# Patient Record
Sex: Male | Born: 1945 | Race: White | Hispanic: No | Smoking: Current every day smoker
Health system: Southern US, Community
[De-identification: ages and names within clinical notes are randomized; demographics above are authoritative.]

## PROBLEM LIST (undated history)

## (undated) DIAGNOSIS — F329 Major depressive disorder, single episode, unspecified: Secondary | ICD-10-CM

## (undated) DIAGNOSIS — K219 Gastro-esophageal reflux disease without esophagitis: Secondary | ICD-10-CM

## (undated) DIAGNOSIS — Z8711 Personal history of peptic ulcer disease: Secondary | ICD-10-CM

## (undated) DIAGNOSIS — F4310 Post-traumatic stress disorder, unspecified: Secondary | ICD-10-CM

## (undated) DIAGNOSIS — F32A Depression, unspecified: Secondary | ICD-10-CM

## (undated) DIAGNOSIS — F419 Anxiety disorder, unspecified: Secondary | ICD-10-CM

## (undated) HISTORY — DX: Major depressive disorder, single episode, unspecified: F32.9

## (undated) HISTORY — DX: Depression, unspecified: F32.A

## (undated) HISTORY — DX: Personal history of peptic ulcer disease: Z87.11

## (undated) HISTORY — DX: Anxiety disorder, unspecified: F41.9

## (undated) HISTORY — DX: Gastro-esophageal reflux disease without esophagitis: K21.9

---

## 1984-08-12 DIAGNOSIS — Z8711 Personal history of peptic ulcer disease: Secondary | ICD-10-CM

## 1984-08-12 HISTORY — PX: STOMACH SURGERY: SHX791

## 1984-08-12 HISTORY — DX: Personal history of peptic ulcer disease: Z87.11

## 2000-08-06 ENCOUNTER — Emergency Department (HOSPITAL_COMMUNITY): Admission: EM | Admit: 2000-08-06 | Discharge: 2000-08-06 | Payer: Self-pay | Admitting: Emergency Medicine

## 2000-08-07 ENCOUNTER — Other Ambulatory Visit (HOSPITAL_COMMUNITY): Admission: RE | Admit: 2000-08-07 | Discharge: 2000-08-07 | Payer: Self-pay | Admitting: Psychiatry

## 2000-08-08 ENCOUNTER — Other Ambulatory Visit (HOSPITAL_COMMUNITY): Admission: RE | Admit: 2000-08-08 | Discharge: 2000-08-18 | Payer: Self-pay | Admitting: Psychiatry

## 2002-10-30 ENCOUNTER — Emergency Department (HOSPITAL_COMMUNITY): Admission: EM | Admit: 2002-10-30 | Discharge: 2002-10-31 | Payer: Self-pay | Admitting: Emergency Medicine

## 2014-10-28 ENCOUNTER — Encounter: Payer: Self-pay | Admitting: Family

## 2014-10-28 ENCOUNTER — Ambulatory Visit (INDEPENDENT_AMBULATORY_CARE_PROVIDER_SITE_OTHER): Payer: PPO | Admitting: Family

## 2014-10-28 VITALS — BP 130/62 | HR 62 | Temp 97.9°F | Resp 16 | Ht 68.0 in | Wt 121.2 lb

## 2014-10-28 DIAGNOSIS — I10 Essential (primary) hypertension: Secondary | ICD-10-CM

## 2014-10-28 DIAGNOSIS — R519 Headache, unspecified: Secondary | ICD-10-CM

## 2014-10-28 DIAGNOSIS — R972 Elevated prostate specific antigen [PSA]: Secondary | ICD-10-CM

## 2014-10-28 DIAGNOSIS — K219 Gastro-esophageal reflux disease without esophagitis: Secondary | ICD-10-CM

## 2014-10-28 DIAGNOSIS — Z720 Tobacco use: Secondary | ICD-10-CM

## 2014-10-28 DIAGNOSIS — R51 Headache: Secondary | ICD-10-CM

## 2014-10-28 DIAGNOSIS — H269 Unspecified cataract: Secondary | ICD-10-CM

## 2014-10-28 DIAGNOSIS — F411 Generalized anxiety disorder: Secondary | ICD-10-CM

## 2014-10-28 DIAGNOSIS — Z87898 Personal history of other specified conditions: Secondary | ICD-10-CM

## 2014-10-28 LAB — BASIC METABOLIC PANEL
BUN: 9 mg/dL (ref 6–23)
CHLORIDE: 101 meq/L (ref 96–112)
CO2: 24 meq/L (ref 19–32)
CREATININE: 0.85 mg/dL (ref 0.50–1.35)
Calcium: 9.3 mg/dL (ref 8.4–10.5)
Glucose, Bld: 86 mg/dL (ref 70–99)
Potassium: 5.2 mEq/L (ref 3.5–5.3)
Sodium: 134 mEq/L — ABNORMAL LOW (ref 135–145)

## 2014-10-28 MED ORDER — FLUOXETINE HCL 40 MG PO CAPS: 40.0000 mg | ORAL_CAPSULE | Freq: Every day | ORAL | Status: DC

## 2014-10-28 MED ORDER — LISINOPRIL 10 MG PO TABS: 10.0000 mg | ORAL_TABLET | Freq: Every day | ORAL | Status: DC

## 2014-10-28 MED ORDER — MELOXICAM 7.5 MG PO TABS: 7.5000 mg | ORAL_TABLET | Freq: Every day | ORAL | Status: AC | PRN

## 2014-10-28 MED ORDER — ZOLPIDEM TARTRATE 10 MG PO TABS: 10.0000 mg | ORAL_TABLET | Freq: Every evening | ORAL | Status: DC | PRN
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
## 2014-10-28 NOTE — Patient Instructions (Addendum)
You will be contact about your referral to Urology.   Please let us know if you have not heard back within 1 week about your referral. Complete lab work prior to leaving. Follow up in 1 month for a medicare wellness visit.  Welcome to [facility]!

## 2014-10-28 NOTE — Progress Notes (Signed)
Pre visit review using our clinic review tool, if applicable. No additional management support is needed unless otherwise documented below in the visit note. 

## 2014-10-29 DIAGNOSIS — R51 Headache: Secondary | ICD-10-CM

## 2014-10-29 DIAGNOSIS — I10 Essential (primary) hypertension: Secondary | ICD-10-CM | POA: Insufficient documentation

## 2014-10-29 DIAGNOSIS — F329 Major depressive disorder, single episode, unspecified: Secondary | ICD-10-CM | POA: Insufficient documentation

## 2014-10-29 DIAGNOSIS — H269 Unspecified cataract: Secondary | ICD-10-CM | POA: Insufficient documentation

## 2014-10-29 DIAGNOSIS — R519 Headache, unspecified: Secondary | ICD-10-CM | POA: Insufficient documentation

## 2014-10-29 DIAGNOSIS — K219 Gastro-esophageal reflux disease without esophagitis: Secondary | ICD-10-CM | POA: Insufficient documentation

## 2014-10-29 DIAGNOSIS — F32A Depression, unspecified: Secondary | ICD-10-CM | POA: Insufficient documentation

## 2014-10-29 DIAGNOSIS — Z87898 Personal history of other specified conditions: Secondary | ICD-10-CM | POA: Insufficient documentation

## 2014-10-29 DIAGNOSIS — F419 Anxiety disorder, unspecified: Secondary | ICD-10-CM

## 2014-10-29 DIAGNOSIS — Z720 Tobacco use: Secondary | ICD-10-CM | POA: Insufficient documentation

## 2014-10-29 NOTE — Assessment & Plan Note (Signed)
Refer to urology for ongoing management.  Reports neg prostate biopsy in past.

## 2014-10-29 NOTE — Assessment & Plan Note (Signed)
Noted on exam, would like referral to opthalmology. Will arrange.

## 2014-10-29 NOTE — Assessment & Plan Note (Signed)
Controlled with use of prn meloxicam.

## 2014-10-29 NOTE — Assessment & Plan Note (Signed)
Currently well controlled. He would like refills of his xanax and [place] today. We discussed that I will not be able to prescribe these meds if he continues marijuana use.  He tells me he would rather continue xanax and [place] and d/c marijuana.  A controlled substance contract is signed today. Refills provided.  He knows that that we will obtain UDS next visit and if + for marijuana, no further refills will be provided.

## 2014-10-29 NOTE — Assessment & Plan Note (Signed)
3-5 minutes spent on tobacco cessation counseling, he is not currently motivated.

## 2014-10-29 NOTE — Assessment & Plan Note (Signed)
Stable on current meds, continue same, obtain bmet.  

## 2014-10-29 NOTE — Assessment & Plan Note (Signed)
Stable on otc prilosec, continue same. 

## 2014-10-31 ENCOUNTER — Telehealth: Payer: Self-pay | Admitting: Family

## 2014-10-31 ENCOUNTER — Encounter: Payer: Self-pay | Admitting: Family

## 2014-10-31 NOTE — Telephone Encounter (Signed)
emmi emailed °

## 2014-11-01 ENCOUNTER — Telehealth: Payer: Self-pay | Admitting: *Deleted

## 2014-11-01 DIAGNOSIS — E871 Hypo-osmolality and hyponatremia: Secondary | ICD-10-CM

## 2014-11-01 NOTE — Telephone Encounter (Signed)
Notified pt and scheduled lab appt for 11/07/14 at 1:15pm. Future lab order entered.

## 2014-11-01 NOTE — Telephone Encounter (Signed)
-----   Message from Sandford CrazeMelissa O'Sullivan, NP sent at 10/29/2014  8:06 AM EDT ----- Sodium is a little low, repeat bmet in 1 week, dx hyponatremia.

## 2014-11-07 ENCOUNTER — Other Ambulatory Visit (INDEPENDENT_AMBULATORY_CARE_PROVIDER_SITE_OTHER): Payer: PPO

## 2014-11-07 DIAGNOSIS — E871 Hypo-osmolality and hyponatremia: Secondary | ICD-10-CM

## 2014-11-07 LAB — BASIC METABOLIC PANEL
BUN: 7 mg/dL (ref 6–23)
CHLORIDE: 99 meq/L (ref 96–112)
CO2: 28 meq/L (ref 19–32)
CREATININE: 0.96 mg/dL (ref 0.40–1.50)
Calcium: 9.9 mg/dL (ref 8.4–10.5)
GFR: 83 mL/min (ref >60.00)
Glucose, Bld: 105 mg/dL — ABNORMAL HIGH (ref 70–99)
Potassium: 5.9 mEq/L — ABNORMAL HIGH (ref 3.5–5.1)
SODIUM: 132 meq/L — AB (ref 135–145)

## 2014-11-09 ENCOUNTER — Telehealth: Payer: Self-pay | Admitting: Family

## 2014-11-09 MED ORDER — ALPRAZOLAM 1 MG PO TABS: 1.0000 mg | ORAL_TABLET | Freq: Four times a day (QID) | ORAL | Status: DC

## 2014-11-09 NOTE — Telephone Encounter (Signed)
Notified pt. He states that he is heading out of town and doesn't know when he will be back. Advised pt that we will discard current Rx and he may notify us when he is back in town and we will address below recommendation at that time.  Pt voices understanding.

## 2014-11-09 NOTE — Telephone Encounter (Signed)
I will allow this refill with the stipulation that he come pick up the Rx in office today and provide a UDS sample today.  Again as Melissa stated, he is not to test positive for marijuana further if he is to continue receiving medication. .  I will also only allow a limited fill -- 60 tablets to carry him until UDS result is in. Rx and UDS note at front desk

## 2014-11-09 NOTE — Telephone Encounter (Signed)
Cody--- Would you be able to provide a Xanax Rx for this pt or [facility]? Please see 10/28/14 office note.

## 2014-11-09 NOTE — Telephone Encounter (Signed)
Caller name:Dettmann, Corin Relation to ZO:XWRUpt:self Call back numbeSharma Covertr:[phone] Pharmacy:  Reason for call: pt is needing rxALPRAZolam (XANAX) 1 MG tablet , please call when available and ready for pick up. Pt needs the rx because [place] soon as he can get the rx

## 2014-11-14 NOTE — Telephone Encounter (Signed)
Patient called in stating that he is back in town and is requesting to do UDS. Best # [phone]

## 2014-11-14 NOTE — Telephone Encounter (Signed)
Melissa-- Please see below messages and advise if ok to reprint Rx and have pt complete UDS when he picks up Rx?

## 2014-11-14 NOTE — Telephone Encounter (Signed)
Please have him complete UDS.  Too soon to refill his xanax.

## 2014-11-15 MED ORDER — ALPRAZOLAM 1 MG PO TABS
1.0000 mg | ORAL_TABLET | Freq: Four times a day (QID) | ORAL | Status: DC
Start: 2014-11-15 — End: 2014-11-25

## 2014-11-15 NOTE — Telephone Encounter (Signed)
Per verbal from Provider, ok to provider Alprazolam Rx as previous Rx printed on 11/09/14 was shredded. Rx printed for Provider signature.

## 2014-11-15 NOTE — Telephone Encounter (Signed)
Notified pt and he voices understanding. Rx placed at front desk with note to obtain UDS at time of pick up.

## 2014-11-15 NOTE — Addendum Note (Signed)
Addended by: Mervin KungFERGERSON, Myalynn Lingle A on: 11/15/2014 08:46 AM   Modules accepted: Orders

## 2014-11-25 ENCOUNTER — Other Ambulatory Visit: Payer: Self-pay | Admitting: Family

## 2014-11-25 ENCOUNTER — Encounter: Payer: Self-pay | Admitting: Family

## 2014-12-02 ENCOUNTER — Encounter: Payer: Self-pay | Admitting: *Deleted

## 2014-12-02 ENCOUNTER — Telehealth: Payer: Self-pay | Admitting: *Deleted

## 2014-12-02 NOTE — Telephone Encounter (Signed)
Pre-Visit Call completed with patient and chart updated.   Pre-Visit Info documented in Specialty Comments under [employer].    

## 2014-12-05 ENCOUNTER — Ambulatory Visit (INDEPENDENT_AMBULATORY_CARE_PROVIDER_SITE_OTHER): Payer: PPO | Admitting: Family

## 2014-12-05 ENCOUNTER — Encounter: Payer: Self-pay | Admitting: Family

## 2014-12-05 ENCOUNTER — Telehealth: Payer: Self-pay | Admitting: Family

## 2014-12-05 VITALS — BP 124/78 | HR 75 | Temp 97.5°F | Resp 16 | Ht 68.0 in | Wt 115.4 lb

## 2014-12-05 DIAGNOSIS — Z0000 Encounter for general adult medical examination without abnormal findings: Secondary | ICD-10-CM

## 2014-12-05 DIAGNOSIS — Z23 Encounter for immunization: Secondary | ICD-10-CM | POA: Diagnosis not present

## 2014-12-05 DIAGNOSIS — E871 Hypo-osmolality and hyponatremia: Secondary | ICD-10-CM | POA: Diagnosis not present

## 2014-12-05 DIAGNOSIS — R634 Abnormal weight loss: Secondary | ICD-10-CM

## 2014-12-05 DIAGNOSIS — I10 Essential (primary) hypertension: Secondary | ICD-10-CM

## 2014-12-05 DIAGNOSIS — F411 Generalized anxiety disorder: Secondary | ICD-10-CM

## 2014-12-05 DIAGNOSIS — Z87898 Personal history of other specified conditions: Secondary | ICD-10-CM

## 2014-12-05 DIAGNOSIS — F1011 Alcohol abuse, in remission: Secondary | ICD-10-CM

## 2014-12-05 DIAGNOSIS — H9190 Unspecified hearing loss, unspecified ear: Secondary | ICD-10-CM

## 2014-12-05 DIAGNOSIS — Z720 Tobacco use: Secondary | ICD-10-CM

## 2014-12-05 LAB — BASIC METABOLIC PANEL
BUN: 6 mg/dL (ref 6–23)
CHLORIDE: 97 meq/L (ref 96–112)
CO2: 25 mEq/L (ref 19–32)
CREATININE: 0.79 mg/dL (ref 0.40–1.50)
Calcium: 9.6 mg/dL (ref 8.4–10.5)
GFR: 103.9 mL/min (ref >60.00)
GLUCOSE: 106 mg/dL — AB (ref 70–99)
POTASSIUM: 5 meq/L (ref 3.5–5.1)
SODIUM: 127 meq/L — AB (ref 135–145)

## 2014-12-05 LAB — TSH: TSH: 0.57 u[IU]/mL (ref 0.35–4.50)

## 2014-12-05 NOTE — Assessment & Plan Note (Signed)
Pt was counseled on smoking cessation.

## 2014-12-05 NOTE — Progress Notes (Signed)
Subjective:    Patient ID: Blake Little, male    DOB: Aug 11, 1946, 69 y.o.   MRN: [id number]  HPI    Review of Systems  Constitutional: Positive for unexpected weight change.       Wt Readings from Last 3 Encounters: 12/05/14 : 115 lb 6.4 oz (52.345 kg) 10/28/14 : 121 lb 3.2 oz (54.976 kg) Has had some weight loss- only eats when he gets hungry.  Lives with sister who does not cook  HENT: Positive for hearing loss.   Eyes: Negative for visual disturbance.  Respiratory: Negative for cough.   Cardiovascular: Negative for leg swelling.  Gastrointestinal: Negative for constipation.  Genitourinary: Positive for frequency.  Musculoskeletal: Negative for arthralgias.       Some neck pain with turning- briefly  Skin: Negative for rash.  Neurological: Negative for headaches.  Hematological: Negative for adenopathy.  Psychiatric/Behavioral:       See HPI    Past Medical History  Diagnosis Date  . Depression   . GERD (gastroesophageal reflux disease)   . History of bleeding ulcers 1986    hospitalized    History   Social History  . Marital Status: Unknown    Spouse Name: N/A  . Number of Children: N/A  . Years of Education: N/A   Occupational History  . Not on file.   Social History Main Topics  . Smoking status: Current Every Day Smoker -- 0.50 packs/day for 50 years    Types: Cigarettes  . Smokeless tobacco: Not on file  . Alcohol Use: No  . Drug Use: Yes     Comment: marijuana   . Sexual Activity: Not on file   Other Topics Concern  . Not on file   Social History Narrative   In Divorce   3 sons- [place] in [place], other sons live locally   1 daughter- lives locally   Brother and sisters live locally (1 of 7 children)   Retired Cabin crewtrain driver    Completed HS and technically schools   Enjoys golf/outside activities       Past Surgical History  Procedure Laterality Date  . Stomach surgery  1986    due to ulcers, pt unsure of procedure    Family  History  Problem Relation Age of Onset  . COPD Mother   . Heart attack Father     Allergies  Allergen Reactions  . Asa [Aspirin]     Abdominal pain    Current Outpatient Prescriptions on File Prior to Visit  Medication Sig Dispense Refill  . acetaminophen (TYLENOL) 650 MG CR tablet Take 650 mg by mouth every 8 (eight) hours as needed for pain.    Marland Kitchen. ALPRAZolam (XANAX) 1 MG tablet Take 1 mg by mouth every 6 (six) hours as needed for anxiety.    Marland Kitchen. FLUoxetine (PROZAC) 40 MG capsule Take 1 capsule (40 mg total) by mouth daily. 30 capsule 5  . lisinopril (PRINIVIL,ZESTRIL) 10 MG tablet Take 1 tablet (10 mg total) by mouth daily. 30 tablet 5  . meloxicam (MOBIC) 7.5 MG tablet Take 1 tablet (7.5 mg total) by mouth daily as needed for pain. 30 tablet 5  . omeprazole (PRILOSEC) 20 MG capsule Take 20 mg by mouth daily.     No current facility-administered medications on file prior to visit.    BP 124/78 mmHg  Pulse 75  Temp(Src) 97.5 F (36.4 C) (Oral)  Resp 16  Ht 5\' 8"  (1.727 m)  Wt 115 lb  6.4 oz (52.345 kg)  BMI 17.55 kg/m2  SpO2 99%        Objective:   Physical Exam  Physical Exam  Constitutional: Thin white male. He is oriented to person, place, and time. He appears well-developed and well-nourished. No distress.  HENT:  Head: Normocephalic and atraumatic.  Right Ear: Tympanic membrane and ear canal normal.  Left Ear: Tympanic membrane and ear canal normal.  Mouth/Throat: Oropharynx is clear and moist.  Eyes: Pupils are equal, round, and reactive to light. No scleral icterus.  Neck: Normal range of motion. No thyromegaly present.  Cardiovascular: Normal rate and regular rhythm.   No murmur heard. Pulmonary/Chest: Effort normal and breath sounds normal. No respiratory distress. He has no wheezes. He has no rales. He exhibits no tenderness.  Abdominal: Soft. Bowel sounds are normal. He exhibits no distension and no mass. There is no tenderness. There is no rebound and no  guarding.  Musculoskeletal: He exhibits no edema.  Lymphadenopathy:    He has no cervical adenopathy.  Neurological: He is alert and oriented to person, place, and time. He has normal patellar reflexes. He exhibits normal muscle tone. Coordination normal.  Skin: Skin is warm and dry.  Psychiatric: He has a normal mood and affect. His behavior is normal. Judgment and thought content normal.          Assessment & Plan:         Assessment & Plan:

## 2014-12-05 NOTE — Assessment & Plan Note (Addendum)
Discussed healthy diet ( need to eat more calories).  Will obtain TSH due to weight loss.  If weight loss persists, may need to explore w/u for possibility of underlying malignancy.  We will recheck weight in 1 month along with some memory testing. Refer for colonoscopy. Td   today. Consider zostavax next visit if he finds his insurance will cover. Refer to audiology to evaluate hearing loss.

## 2014-12-05 NOTE — Assessment & Plan Note (Signed)
Discussed with pt need to discontinue marijuana. We discussed that we will drug test him next time refill is due. If still positive for marijuana- will no longer refill ativan. He reports last use was several weeks ago.

## 2014-12-05 NOTE — Telephone Encounter (Signed)
Could you please mail a copy of the MOST form and HCPOA form to pt? Thanks.

## 2014-12-05 NOTE — Telephone Encounter (Signed)
Forms mailed 

## 2014-12-05 NOTE — Assessment & Plan Note (Signed)
Management per urology 

## 2014-12-05 NOTE — Progress Notes (Signed)
Pre visit review using our clinic review tool, if applicable. No additional management support is needed unless otherwise documented below in the visit note. 

## 2014-12-05 NOTE — Patient Instructions (Addendum)
Follow up in 1 month for memory testing.   Work on eating 3 well balanced meals and 2 snacks a day. Work on eliminating tobacco and marijuana. Complete lab work prior to leaving.

## 2014-12-05 NOTE — Progress Notes (Signed)
Patient ID: CHRISTERPHER CLOS, male   DOB: 1946-07-27, 69 y.o.   MRN: [id number]  Subjective:    Blake Little is a 69 y.o. male who presents for Medicare Annual/Subsequent preventive examination.     Preventive Screening-Counseling & Management  Tobacco History  Smoking status  . Current Every Day Smoker -- 0.50 packs/day for 50 years  . Types: Cigarettes  Smokeless tobacco  . Not on file    Problems Prior to Visit 1. Anxiety- failed UDS last month- + cannabis. Reports that he takes one around 12 pm and one around 6pm. He continues prozac.    2) HTN-  Patient is currently maintained on the following medications for blood pressure: lisinopril Patient reports good compliance with blood pressure medications. Patient denies chest pain, shortness of breath or swelling. Last 3 blood pressure readings in our office are as follows: BP Readings from Last 3 Encounters:  12/05/14 124/78  10/28/14 130/62   3) Elevated PSA- following with Dr. Brunilda Payor.   Preventative care:    Allergies verified: UTD   Immunization Status: prevnar 06/13/14 Flu vaccine-- has not had  Tdap-- unknown  PNA-- reports that he got this at the pharmacy [facility]) reports > 1 year ago.   Shingles-- has not had, would like, notified to check with insurance for coverage   A/P:  Changes to FH, PSH or Personal Hx: CCS: never that he knows if PSA: per urology Bone Density: has not had   Care Teams Updated: Su Grand, MD- Urology   ED/Hospital/Urgent Care Visits: None recent   Current Problems (verified) Patient Active Problem List   Diagnosis Date Noted  . Anxiety state 10/29/2014  . Hypertension 10/29/2014  . GERD (gastroesophageal reflux disease) 10/29/2014  . Tobacco abuse 10/29/2014  . History of elevated PSA 10/29/2014  . Chronic headaches 10/29/2014  . Cataracts, bilateral 10/29/2014    Medications Prior to Visit Current Outpatient Prescriptions on File Prior to Visit  Medication Sig Dispense  Refill  . acetaminophen (TYLENOL) 650 MG CR tablet Take 650 mg by mouth every 8 (eight) hours as needed for pain.    Marland Kitchen ALPRAZolam (XANAX) 1 MG tablet Take 1 mg by mouth every 6 (six) hours as needed for anxiety.    Marland Kitchen FLUoxetine (PROZAC) 40 MG capsule Take 1 capsule (40 mg total) by mouth daily. 30 capsule 5  . lisinopril (PRINIVIL,ZESTRIL) 10 MG tablet Take 1 tablet (10 mg total) by mouth daily. 30 tablet 5  . meloxicam (MOBIC) 7.5 MG tablet Take 1 tablet (7.5 mg total) by mouth daily as needed for pain. 30 tablet 5  . omeprazole (PRILOSEC) 20 MG capsule Take 20 mg by mouth daily.     No current facility-administered medications on file prior to visit.    Current Medications (verified) Current Outpatient Prescriptions  Medication Sig Dispense Refill  . acetaminophen (TYLENOL) 650 MG CR tablet Take 650 mg by mouth every 8 (eight) hours as needed for pain.    Marland Kitchen ALPRAZolam (XANAX) 1 MG tablet Take 1 mg by mouth every 6 (six) hours as needed for anxiety.    Marland Kitchen FLUoxetine (PROZAC) 40 MG capsule Take 1 capsule (40 mg total) by mouth daily. 30 capsule 5  . lisinopril (PRINIVIL,ZESTRIL) 10 MG tablet Take 1 tablet (10 mg total) by mouth daily. 30 tablet 5  . meloxicam (MOBIC) 7.5 MG tablet Take 1 tablet (7.5 mg total) by mouth daily as needed for pain. 30 tablet 5  . omeprazole (PRILOSEC) 20 MG capsule Take  20 mg by mouth daily.     No current facility-administered medications for this visit.     Allergies (verified) Asa   PAST HISTORY  Family History Family History  Problem Relation Age of Onset  . COPD Mother   . Heart attack Father     Social History History  Substance Use Topics  . Smoking status: Current Every Day Smoker -- 0.50 packs/day for 50 years    Types: Cigarettes  . Smokeless tobacco: Not on file  . Alcohol Use: No    Are there smokers in your home (other than you)?  Yes  Risk Factors Current exercise habits: plays golf regularly, usually walks  Dietary issues  discussed: healthy diet discussed   Cardiac risk factors: advanced age (older than 62 for men, 36 for women), hypertension, male gender and smoking/ tobacco exposure.  Depression Screen (Note: if answer to either of the following is "Yes", a more complete depression screening is indicated)   Q1: Over the past two weeks, have you felt down, depressed or hopeless? No  Q2: Over the past two weeks, have you felt little interest or pleasure in doing things? No  Have you lost interest or pleasure in daily life? No  Do you often feel hopeless? No  Do you cry easily over simple problems? No  Activities of Daily Living In your present state of health, do you have any difficulty performing the following activities?:  Driving? No Managing money?  No Feeding yourself? no Getting from bed to chair? No Climbing a flight of stairs? No Preparing food and eating?: No Bathing or showering? No Getting dressed: No Getting to the toilet? No Using the toilet:No Moving around from place to place: No In the past year have you fallen or had a near fall?:Yes- reports that he went to the hospital last July follow a syncopal event.  Was told that it was due to benzo withdrawal.    Are you sexually active?  No  Do you have more than one partner?  No  Hearing Difficulties: Yes Do you often ask people to speak up or repeat themselves? Yes Do you experience ringing or noises in your ears? No Do you have difficulty understanding soft or whispered voices? Yes   Do you feel that you have a problem with memory? Yes- some issue with short term memory.  periodi confusion.    Do you often misplace items? Yes  Do you feel safe at home?  Yes  Cognitive Testing  Alert? Yes  Normal Appearance?Yes  Oriented to person? Yes  Place? Yes   Time? Yes  Recall of three objects?  Yes  Can perform simple calculations? Yes  Displays appropriate judgment?Yes  Can read the correct time from a watch face?Yes   Advanced  Directives have been discussed with the patient? Yes   List the Names of Other Physician/Practitioners you currently use: 1.    Indicate any recent Medical Services you may have received from other than [facility] providers in the past year (date may be approximate).  Immunization History  Administered Date(s) Administered  . Influenza-Unspecified 06/13/2014  . Pneumococcal Conjugate-13 06/13/2014    Screening Tests Health Maintenance  Topic Date Due  . TETANUS/TDAP  09/06/1966  . COLONOSCOPY  09/06/1997  . ZOSTAVAX  09/07/2007  . PNA vac Low Risk Adult (1 of 2 - PCV13) 09/06/2012  . INFLUENZA VACCINE  03/13/2015    All answers were reviewed with the patient and necessary referrals were made:  Little,Blake Demasi  S., NP   12/05/2014   History reviewed: allergies, current medications, past family history, past medical history, past social history, past surgical history and problem list  Review of Systems see above.     Objective:     Vision by Snellen chart: see nursing.  Blood pressure 124/78, pulse 75, temperature 97.5 F (36.4 C), temperature source Oral, resp. rate 16, height 5\' 8"  (1.727 m), weight 115 lb 6.4 oz (52.345 kg), SpO2 99 %. Body mass index is 17.55 kg/(m^2).     Assessment:          Plan:     During the course of the visit the patient was educated and counseled about appropriate screening and preventive services including:    Td vaccine  Colorectal cancer screening  Smoking cessation counseling  Advanced directives: has an advanced directive - a copy has been provided, .  Diet review for nutrition referral? Yes ____  Not Indicated __x__   Patient Instructions (the written plan) was given to the patient.  Medicare Attestation I have personally reviewed: The patient's medical and social history Their use of alcohol, tobacco or illicit drugs Their current medications and supplements The patient's functional ability including ADLs,fall  risks, home safety risks, cognitive, and hearing and visual impairment Diet and physical activities Evidence for depression or mood disorders  The patient's weight, height, BMI, and visual acuity have been recorded in the chart.  I have made referrals, counseling, and provided education to the patient based on review of the above and I have provided the patient with a written personalized care plan for preventive services.     Little,Blake Wieman S., NP   12/05/2014

## 2014-12-05 NOTE — Assessment & Plan Note (Signed)
BP Readings from Last 3 Encounters:  12/05/14 124/78  10/28/14 130/62   BP stable, continue lisinopril.

## 2014-12-06 ENCOUNTER — Telehealth: Payer: Self-pay | Admitting: Family

## 2014-12-06 DIAGNOSIS — Z87891 Personal history of nicotine dependence: Secondary | ICD-10-CM

## 2014-12-06 DIAGNOSIS — E871 Hypo-osmolality and hyponatremia: Secondary | ICD-10-CM

## 2014-12-06 DIAGNOSIS — R739 Hyperglycemia, unspecified: Secondary | ICD-10-CM

## 2014-12-06 NOTE — Telephone Encounter (Signed)
Sugar also a little high, i will add a1c.

## 2014-12-06 NOTE — Telephone Encounter (Signed)
Notified pt and he will return to the lab tomorrow at 11am for additional tests. Orders signed. Pt agreeable to proceed with CT chest. Pt states he drinks at least 8 cups of coffee daily and 3-4  12oz bottles of water a day. Advised pt of below recommendation and he voices understanding.

## 2014-12-06 NOTE — Telephone Encounter (Signed)
Please let pt know that his sodium is very low.  I would like him to return for the lab work listed below as well as a CT chest to screen for lung cancer due to hx of tobacco abuse.  He should liberalize sodium in his diet.  Do not drink more than 64 ounces of fluid a day. How much is he drinking now?

## 2014-12-07 ENCOUNTER — Other Ambulatory Visit (INDEPENDENT_AMBULATORY_CARE_PROVIDER_SITE_OTHER): Payer: PPO

## 2014-12-07 DIAGNOSIS — R739 Hyperglycemia, unspecified: Secondary | ICD-10-CM | POA: Diagnosis not present

## 2014-12-07 DIAGNOSIS — E871 Hypo-osmolality and hyponatremia: Secondary | ICD-10-CM

## 2014-12-07 LAB — HEMOGLOBIN A1C: HEMOGLOBIN A1C: 6 % (ref 4.6–6.5)

## 2014-12-08 ENCOUNTER — Telehealth: Payer: Self-pay | Admitting: Acute Care

## 2014-12-08 ENCOUNTER — Ambulatory Visit (HOSPITAL_BASED_OUTPATIENT_CLINIC_OR_DEPARTMENT_OTHER)
Admission: RE | Admit: 2014-12-08 | Discharge: 2014-12-08 | Disposition: A | Discharge status: Home / Self Care | Payer: PPO | Source: Ambulatory Visit | Attending: Family | Admitting: Family

## 2014-12-08 ENCOUNTER — Encounter: Payer: Self-pay | Admitting: Acute Care

## 2014-12-08 ENCOUNTER — Telehealth: Payer: Self-pay | Admitting: *Deleted

## 2014-12-08 DIAGNOSIS — Z87891 Personal history of nicotine dependence: Secondary | ICD-10-CM | POA: Diagnosis not present

## 2014-12-08 DIAGNOSIS — Z122 Encounter for screening for malignant neoplasm of respiratory organs: Secondary | ICD-10-CM | POA: Insufficient documentation

## 2014-12-08 LAB — OSMOLALITY: Osmolality: 275 mOsm/kg (ref 275–300)

## 2014-12-08 LAB — SODIUM, URINE, RANDOM: Sodium, Ur: 78 mEq/L

## 2014-12-08 LAB — OSMOLALITY, URINE: Osmolality, Ur: 581 mOsm/kg (ref 390–1090)

## 2014-12-08 NOTE — Telephone Encounter (Signed)
Follow up call and message left for Blake [facility]Ashley [facility] regarding the resolve of qualifying this patient for a Low Dose lung cancer screening CT Scan. After assessing the patient's smoking history more closely, we determined he does qualify for the program, and the appropriate scan was performed.

## 2014-12-08 NOTE — Telephone Encounter (Signed)
I received a call today ( 12/08/14) from the CT tech at [facility]. [name] was scheduled for a LDCT scan ( ordered by Daryel GeraldMelissa Sullivan, NP), however the documented pack year history did not qualify him for the Lung Cancer Screening  program. I attempted to call Daryel GeraldMelissa Sullivan, who is out of the office today, to have her assist in assessing if this patient qualifies for the program. Instead I had Maralyn SagoSarah from CT call me so I could actually speak with the patient and get a more accurate smoking history from him. After speaking with the patient he clarified that he has been smoking since he was 69 years old, at least 15 cigarettes (  3/4 pack) a day. This calculates to a 39.75 pack year (.75 PPD x 53 years) history, which does qualify him for the program. Based on this information, we did the screening CT scan as ordered. I have corrected the patient's smoking history in EPIC.

## 2014-12-08 NOTE — Telephone Encounter (Signed)
Pulmonary department calling regarding orders- RN was hoping to talk to Sandford CrazeMelissa O'Sullivan and stated she would call back with a request once she figured out what the MD wanted ordered.

## 2014-12-11 ENCOUNTER — Telehealth: Payer: Self-pay | Admitting: Family

## 2014-12-11 NOTE — Telephone Encounter (Signed)
-----   Message from Oretha Milchakesh Alva V, MD sent at 12/11/2014 10:10 AM EDT ----- 3 month FU ok He has impressive bronchiectasis We should probably see him Marcelino DusterMichelle, pl make appt for [facility] office- not urgent RA ----- Message -----    From: Sandford CrazeMelissa O'Sullivan, NP    Sent: 12/09/2014  12:55 PM      To: Oretha Milchakesh Alva V, MD  Kathy Breachakesh,  Would you mind glancing at his CT chest?  I am concerned re: recent unintentional weight loss and hyponatremia (work up consistent with SIADH).  I am thinking that we may need to pursue more timely work up of his abnormal CT chest findings rather than a 3 month follow up.  What are your thoughts?  I have not yet discussed CT findings with him, I wanted to present work up plan to him.  I an arrange formal referral to you if appropriate.    Thanks,  General MillsMelissa

## 2014-12-11 NOTE — Telephone Encounter (Signed)
Please let patient know that I reviewed his CT scan.  There are some lung nodules that need re-evaluation on CT in 3 months.  I would also like for him to meet with pulmonary. They will contact him with an appointment.

## 2014-12-12 NOTE — Telephone Encounter (Signed)
Notified pt and he voices understanding. Lab appt scheduled for 12/16/14 at 10:15am (UDS). Awaiting result to determine if Rx can be given.

## 2014-12-12 NOTE — Telephone Encounter (Signed)
Notified pt and he voices understanding. Pt states he has stopped using marijuana and would like an Rx for Alprazolam. States he really needs something for his anxiety.  Please advise.

## 2014-12-12 NOTE — Telephone Encounter (Signed)
Left message for pt to return my call.

## 2014-12-12 NOTE — Telephone Encounter (Signed)
Since he failed previous drug screen, I will need to see a negative drug screen prior to providing any additional refills.  He can leave UDS at his convenience.

## 2014-12-15 ENCOUNTER — Ambulatory Visit (INDEPENDENT_AMBULATORY_CARE_PROVIDER_SITE_OTHER): Payer: PPO | Admitting: Pulmonary Disease

## 2014-12-15 ENCOUNTER — Encounter: Payer: Self-pay | Admitting: Pulmonary Disease

## 2014-12-15 VITALS — BP 164/86 | HR 71 | Temp 97.6°F | Ht 68.0 in | Wt 120.0 lb

## 2014-12-15 DIAGNOSIS — J432 Centrilobular emphysema: Secondary | ICD-10-CM

## 2014-12-15 DIAGNOSIS — J439 Emphysema, unspecified: Secondary | ICD-10-CM | POA: Insufficient documentation

## 2014-12-15 DIAGNOSIS — R0602 Shortness of breath: Secondary | ICD-10-CM | POA: Diagnosis not present

## 2014-12-15 DIAGNOSIS — R918 Other nonspecific abnormal finding of lung field: Secondary | ICD-10-CM | POA: Insufficient documentation

## 2014-12-15 DIAGNOSIS — J984 Other disorders of lung: Secondary | ICD-10-CM | POA: Diagnosis not present

## 2014-12-15 NOTE — Progress Notes (Signed)
Subjective:    Patient ID: Blake Little, male    DOB: 1946-04-23, 69 y.o.   MRN: [id number]  HPI  Chief Complaint  Patient presents with  . Advice Only    Referred by Dr. Peggyann Juba'sullivan, discuss CT results.  gets night sweats (comes and goes)   69 year old smoker presents for evaluation of abnormal CT scan. he has been smoking since he was 69 years old, at least 15 cigarettes ( 3/4 pack) a day-about 40 Pyrs. He underwent low-dose CT screening which showed multiple pulmonary nodules, moderate paraseptal emphysema and scattered changes of bronchiectasis. He reports dyspnea on exertion-but he admits that he leads a sedentary life. He can walk in the store and carries groceries and can climb one flight of stairs. He denies frequent chest colds. He reports a chronic dry cough. He is unfortunately going through a divorce. [place] moved from [place] to be closer to his family.  Ct chest (LDCT) 12/08/14 >>multiple pulmonary nodules , largest 10mm RUL, moderate emphysema, scattered bronchiectasis Spirometry 12/2014 >>Moderate airway obstruction with a ratio 47 , FEV1 52% , FVC 86%   Past Medical History  Diagnosis Date  . Depression   . GERD (gastroesophageal reflux disease)   . History of bleeding ulcers 1986    hospitalized    Past Surgical History  Procedure Laterality Date  . Stomach surgery  1986    due to ulcers, pt unsure of procedure     Allergies  Allergen Reactions  . Asa [Aspirin]     Abdominal pain    History   Social History  . Marital Status: Divorced    Spouse Name: N/A  . Number of Children: N/A  . Years of Education: N/A   Occupational History  . Not on file.   Social History Main Topics  . Smoking status: Current Every Day Smoker -- 0.75 packs/day for 53 years    Types: Cigarettes  . Smokeless tobacco: Not on file  . Alcohol Use: No  . Drug Use: Yes     Comment: marijuana   . Sexual Activity: Not on file   Other Topics Concern  . Not on file   Social  History Narrative   In Divorce   3 sons- youngest son in [place], other sons live locally   1 daughter- lives locally   Brother and sisters live locally (1 of 7 children)   Retired Cabin crewtrain driver    Completed HS and technically schools   Enjoys golf/outside activities       Review of Systems  Constitutional: Negative for fever, chills, activity change, appetite change and unexpected weight change.  HENT: Negative for congestion, dental problem, postnasal drip, rhinorrhea, sneezing, sore throat, trouble swallowing and voice change.   Eyes: Negative for visual disturbance.  Respiratory: Negative for cough, choking and shortness of breath.   Cardiovascular: Negative for chest pain and leg swelling.  Gastrointestinal: Negative for nausea, vomiting and abdominal pain.  Genitourinary: Negative for difficulty urinating.  Musculoskeletal: Negative for arthralgias.  Skin: Negative for rash.  Psychiatric/Behavioral: Negative for behavioral problems and confusion.       Objective:   Physical Exam Gen. Pleasant, well-nourished, in no distress, normal affect ENT - no lesions, no post nasal drip Neck: No JVD, no thyromegaly, no carotid bruits Lungs: no use of accessory muscles, no dullness to percussion, decreased without rales or rhonchi  Cardiovascular: Rhythm regular, heart sounds  normal, no murmurs or gallops, no peripheral edema Abdomen: soft and non-tender, no hepatosplenomegaly, BS  normal. Musculoskeletal: No deformities, no cyanosis or clubbing Neuro:  alert, non focal        Assessment & Plan:

## 2014-12-15 NOTE — Patient Instructions (Addendum)
CT scan shows emphysema & nodules Lung capacity is at 50% FU Ct scan in 3 months Trial of spiriva - call us for Rx if this works

## 2014-12-15 NOTE — Assessment & Plan Note (Signed)
Needs short-term follow-up of pulmonary nodules in 3 months

## 2014-12-15 NOTE — Assessment & Plan Note (Signed)
CT scan shows emphysema & nodules Lung capacity is at 50% FU Ct scan in 3 months Trial of spiriva - call us for Rx if this works [facility]

## 2014-12-16 ENCOUNTER — Other Ambulatory Visit: Payer: PPO

## 2014-12-19 ENCOUNTER — Encounter: Payer: Self-pay | Admitting: Family

## 2014-12-19 DIAGNOSIS — H3530 Unspecified macular degeneration: Secondary | ICD-10-CM | POA: Insufficient documentation

## 2014-12-21 ENCOUNTER — Telehealth: Payer: Self-pay | Admitting: Family

## 2014-12-21 DIAGNOSIS — E871 Hypo-osmolality and hyponatremia: Secondary | ICD-10-CM

## 2014-12-21 MED ORDER — BUPROPION HCL ER (SR) 150 MG PO TB12: ORAL_TABLET | ORAL | Status: DC

## 2014-12-21 NOTE — Telephone Encounter (Signed)
Notified pt and he voices understanding. Scheduled 1 month f/u for 01/20/15 at 10:45am. Lab appt scheduled for tomorrow at 9:45am and lab order entered.  Received call from Amy re: status of UDS. Should have result by tomorrow. Awaiting UDS.

## 2014-12-21 NOTE — Telephone Encounter (Signed)
Caller name: Devun Relation to pt: self Call back number: [phone] [facility]acy:  [facility] [address]  Reason for call:   1.Requesting last lab results.  2. would like a refill of xanax.  3. Patient states that he is trying to stop smoking and would like to try wellbutrin. He states that chantex did not work for him.

## 2014-12-21 NOTE — Telephone Encounter (Signed)
Spoke with pt. He states he left urine for drug screen on 12/16/14. Sent staff message to Amy to check on status. Please advise re: urine osmolality results and wellbutrin Rx as requested below?

## 2014-12-21 NOTE — Telephone Encounter (Signed)
Rx sent for wellbutrin.  He will need to see us back in the office 1 month after starting and call us if he develops any mood changes or concerns after starting.   Sodium studies indicate that the cause for his low sodium could be due to either SIADH which regulates water secretion through the kidneys or less likely due to hormonal issues with his adrenal gland.  I would like him to return to the lab when he is able to complete AM serum cortisol level (dx hyponatremia) to further evaluate.   I can refill xanax once I review his UDS.

## 2014-12-22 ENCOUNTER — Other Ambulatory Visit: Payer: PPO

## 2014-12-22 DIAGNOSIS — E871 Hypo-osmolality and hyponatremia: Secondary | ICD-10-CM

## 2014-12-23 NOTE — Telephone Encounter (Signed)
Patient called in wanting to know if we have the results of the UDS?   Also, states [facility] [address] [place]

## 2014-12-24 LAB — CORTISOL-AM, BLOOD: Cortisol - AM: 21.4 ug/dL (ref 4.3–22.4)

## 2014-12-25 ENCOUNTER — Telehealth: Payer: Self-pay | Admitting: Family

## 2014-12-25 ENCOUNTER — Encounter: Payer: Self-pay | Admitting: Family

## 2014-12-25 DIAGNOSIS — E222 Syndrome of inappropriate secretion of antidiuretic hormone: Secondary | ICD-10-CM

## 2014-12-25 NOTE — Telephone Encounter (Signed)
Cortisol, (hormone level looks normal).  I would like him to see endocrinology for evaluation of his low sodium.

## 2014-12-26 ENCOUNTER — Other Ambulatory Visit: Payer: Self-pay | Admitting: Pulmonary Disease

## 2014-12-26 DIAGNOSIS — F17200 Nicotine dependence, unspecified, uncomplicated: Secondary | ICD-10-CM

## 2014-12-26 NOTE — Telephone Encounter (Signed)
Unable to reach pt. Only received loud dial tone when I called his #, unable to reach pt.  See additiona phone note.

## 2014-12-26 NOTE — Telephone Encounter (Signed)
Please let pt know that I reviewed his UDS. Unfortunately, it is positive for Benzo metabolites from other benzo's in addition to the xanax that he reported to us. Since this is inconsistent I will not be able to provide him with any xanax refills.

## 2014-12-26 NOTE — Telephone Encounter (Signed)
Left detailed message on Cell # and to call if any questions.

## 2014-12-26 NOTE — Telephone Encounter (Signed)
Patient returned phone call. Best # [phone]

## 2014-12-26 NOTE — Telephone Encounter (Signed)
UDS results did not come through Friday. I left a message for Amy to refax result to main # [phone]. Will verify pharmacy with pt. Unable to reach pt or leave message as I only received a loud tone when I called his #.

## 2014-12-26 NOTE — Telephone Encounter (Signed)
UDS result received and forwarded to Provider for review.  Please advise.

## 2014-12-26 NOTE — Telephone Encounter (Signed)
Left detailed message on pt's cell# and to call if he has not been contacted within 1 week re: referral.

## 2014-12-29 ENCOUNTER — Telehealth: Payer: Self-pay | Admitting: Acute Care

## 2014-12-29 NOTE — Telephone Encounter (Signed)
Opened in error

## 2015-01-04 ENCOUNTER — Ambulatory Visit (INDEPENDENT_AMBULATORY_CARE_PROVIDER_SITE_OTHER): Payer: PPO | Admitting: Family

## 2015-01-04 ENCOUNTER — Encounter: Payer: Self-pay | Admitting: Family

## 2015-01-04 VITALS — BP 124/68 | HR 68 | Temp 97.8°F | Resp 18 | Ht 68.0 in | Wt 116.0 lb

## 2015-01-04 DIAGNOSIS — R918 Other nonspecific abnormal finding of lung field: Secondary | ICD-10-CM

## 2015-01-04 DIAGNOSIS — R413 Other amnesia: Secondary | ICD-10-CM | POA: Insufficient documentation

## 2015-01-04 DIAGNOSIS — R634 Abnormal weight loss: Secondary | ICD-10-CM

## 2015-01-04 DIAGNOSIS — F411 Generalized anxiety disorder: Secondary | ICD-10-CM | POA: Diagnosis not present

## 2015-01-04 DIAGNOSIS — J984 Other disorders of lung: Secondary | ICD-10-CM

## 2015-01-04 MED ORDER — VENLAFAXINE HCL ER 37.5 MG PO CP24: 37.5000 mg | ORAL_CAPSULE | Freq: Every day | ORAL | Status: DC

## 2015-01-04 NOTE — Assessment & Plan Note (Signed)
Weight is overall stable- though it has varied some. Advised pt to monitor his weight at home weekly and call if further weight loss.

## 2015-01-04 NOTE — Assessment & Plan Note (Signed)
MMSE is performed today and pt scored 30/30.  We discussed that stress and anxiety are likely contributing factors.

## 2015-01-04 NOTE — Progress Notes (Signed)
Subjective:    Patient ID: Lorretta Harporman B Macias, male    DOB: 02-Apr-1946, 69 y.o.   MRN: [id number]  HPI   Mr.  Excell SeltzerBaker is a 69 yr old male who presents today for follow up.  Anxiety- last visit his UDS was positive for xanax as well as other benzo metabolites.  Prior to this, he was + for cannabis. He reports a lot of anxiety symptoms.    Pulmonary nodules- had a CT chest which noted multiple pulm nodules. He saw Dr. Vassie LollAlva who recommended a 3 month follow up CT chest. He added spiriva for COPD.   Weight loss-  Reports that his usual weight is between 120-125.  Reports that he has been travelling a lot recently and has not been eating properly. wHe has a scale at home.  Wt Readings from Last 3 Encounters:  01/04/15 116 lb (52.617 kg)  12/15/14 120 lb (54.432 kg)  12/05/14 115 lb 6.4 oz (52.345 kg)   Memory loss- pt presents today for memory testing. Pt reports recent concern with short term memory.     Review of Systems See HPI  Past Medical History  Diagnosis Date  . Depression   . GERD (gastroesophageal reflux disease)   . History of bleeding ulcers 1986    hospitalized    History   Social History  . Marital Status: Divorced    Spouse Name: N/A  . Number of Children: N/A  . Years of Education: N/A   Occupational History  . Not on file.   Social History Main Topics  . Smoking status: Current Every Day Smoker -- 0.75 packs/day for 53 years    Types: Cigarettes  . Smokeless tobacco: Not on file  . Alcohol Use: No  . Drug Use: Yes     Comment: marijuana   . Sexual Activity: Not on file   Other Topics Concern  . Not on file   Social History Narrative   In Divorce   3 sons- youngest son in [place], other sons live locally   1 daughter- lives locally   Brother and sisters live locally (1 of 7 children)   Retired Cabin crewtrain driver    Completed HS and technically schools   Enjoys golf/outside activities       Past Surgical History  Procedure Laterality Date  . Stomach  surgery  1986    due to ulcers, pt unsure of procedure    Family History  Problem Relation Age of Onset  . COPD Mother   . Heart attack Father     Allergies  Allergen Reactions  . Asa [Aspirin]     Abdominal pain    Current Outpatient Prescriptions on File Prior to Visit  Medication Sig Dispense Refill  . acetaminophen (TYLENOL) 650 MG CR tablet Take 650 mg by mouth every 8 (eight) hours as needed for pain.    Marland Kitchen. ALPRAZolam (XANAX) 1 MG tablet Take 1 mg by mouth every 6 (six) hours as needed for anxiety.    Marland Kitchen. buPROPion (WELLBUTRIN SR) 150 MG 12 hr tablet Initial: 150 mg once daily for 3 days; increase to 150 mg twice daily; 60 tablet 1  . FLUoxetine (PROZAC) 40 MG capsule Take 1 capsule (40 mg total) by mouth daily. 30 capsule 5  . lisinopril (PRINIVIL,ZESTRIL) 10 MG tablet Take 1 tablet (10 mg total) by mouth daily. 30 tablet 5  . meloxicam (MOBIC) 7.5 MG tablet Take 1 tablet (7.5 mg total) by mouth daily as needed for pain.  30 tablet 5  . omeprazole (PRILOSEC) 20 MG capsule Take 20 mg by mouth daily.     No current facility-administered medications on file prior to visit.    BP 124/68 mmHg  Pulse 68  Temp(Src) 97.8 F (36.6 C) (Oral)  Resp 18  Ht  (1.727 m)  Wt 116 lb (52.617 kg)  BMI 17.64 kg/m2  SpO2 98%       Objective:   Physical Exam  Constitutional: He is oriented to person, place, and time. He appears well-developed and well-nourished. No distress.  HENT:  Head: Normocephalic and atraumatic.  Musculoskeletal: He exhibits no edema.  Neurological: He is alert and oriented to person, place, and time.  Psychiatric: He has a normal mood and affect. His behavior is normal. Thought content normal.          Assessment & Plan:

## 2015-01-04 NOTE — Patient Instructions (Signed)
Start effexor for anxiety. Follow up in 1 month.

## 2015-01-04 NOTE — Assessment & Plan Note (Signed)
He is following with Dr. Vassie LollAlva (pulmonary) and will complete a follow up CT chest in 3 months.

## 2015-01-04 NOTE — Assessment & Plan Note (Signed)
Uncontrolled. Pt is aware that we cannot prescribe xanax due to UDS results. He is requesting additional help with maintenance med for his anxiety. Will initiate effexor.

## 2015-01-04 NOTE — Progress Notes (Signed)
Pre visit review using our clinic review tool, if applicable. No additional management support is needed unless otherwise documented below in the visit note. 

## 2015-01-20 ENCOUNTER — Ambulatory Visit (INDEPENDENT_AMBULATORY_CARE_PROVIDER_SITE_OTHER): Payer: PPO | Admitting: Family

## 2015-01-20 ENCOUNTER — Encounter: Payer: Self-pay | Admitting: Family

## 2015-01-20 VITALS — BP 142/78 | HR 66 | Temp 97.8°F | Resp 16 | Ht 68.0 in | Wt 118.6 lb

## 2015-01-20 DIAGNOSIS — F419 Anxiety disorder, unspecified: Principal | ICD-10-CM

## 2015-01-20 DIAGNOSIS — F329 Major depressive disorder, single episode, unspecified: Secondary | ICD-10-CM

## 2015-01-20 DIAGNOSIS — F418 Other specified anxiety disorders: Secondary | ICD-10-CM | POA: Diagnosis not present

## 2015-01-20 DIAGNOSIS — Z720 Tobacco use: Secondary | ICD-10-CM | POA: Diagnosis not present

## 2015-01-20 MED ORDER — BUPROPION HCL ER (SR) 150 MG PO TB12: ORAL_TABLET | ORAL | Status: DC

## 2015-01-20 MED ORDER — VENLAFAXINE HCL ER 75 MG PO CP24: 75.0000 mg | ORAL_CAPSULE | Freq: Every day | ORAL | Status: DC

## 2015-01-20 NOTE — Assessment & Plan Note (Signed)
Uncontrolled.  Advised pt to establish with therapist. Continue wellbutrin, increase effexor to 75 mg. Follow up in 6 weeks.

## 2015-01-20 NOTE — Assessment & Plan Note (Signed)
Has cut down on smoking from 1 PPD to 1/2 PPD. Continue wellbutrin, continue cessation efforts.

## 2015-01-20 NOTE — Patient Instructions (Signed)
Increase effexor to 75mg  once daily. Continue wellbutrin. Contact Behavioral health to get established with a therapist- I think this will help you. Follow up in 6 weeks.

## 2015-01-20 NOTE — Progress Notes (Signed)
Subjective:    Patient ID: Blake Little, male    DOB: [birth year], 69 y.o.   MRN: [id number]  HPI  Blake Little is a 69 yr old male who presents today for follow up. Last visit he started wellbutrin for smoking cessation.  He continues to smoke 1/2 PPD. Was smoking 1 PPD.    Does not feel that the wellbutrin is helping with anxiety.  Reports feeling very depressed with family problems/divorce.     Review of Systems See HPI  Past Medical History  Diagnosis Date  . Depression   . GERD (gastroesophageal reflux disease)   . History of bleeding ulcers 1986    hospitalized    History   Social History  . Marital Status: Divorced    Spouse Name: N/A  . Number of Children: N/A  . Years of Education: N/A   Occupational History  . Not on file.   Social History Main Topics  . Smoking status: Current Every Day Smoker -- 0.75 packs/day for 53 years    Types: Cigarettes  . Smokeless tobacco: Not on file  . Alcohol Use: No  . Drug Use: Yes     Comment: marijuana   . Sexual Activity: Not on file   Other Topics Concern  . 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 Stomach surgery  1986    due to ulcers, pt unsure of procedure    Family History  Problem Relation Age of Onset  . COPD Mother   . Heart attack Father     Allergies  Allergen Reactions  . Asa [Aspirin]     Abdominal pain    Current Outpatient Prescriptions on File Prior to Visit  Medication Sig Dispense Refill  . acetaminophen (TYLENOL) 650 MG CR tablet Take 650 mg by mouth every 8 (eight) hours as needed for pain.    Marland Kitchen ALPRAZolam (XANAX) 1 MG tablet Take 1 mg by mouth every 6 (six) hours as needed for  anxiety.    Marland Kitchen FLUoxetine (PROZAC) 40 MG capsule Take 1 capsule (40 mg total) by mouth daily. 30 capsule 5  . lisinopril (PRINIVIL,ZESTRIL) 10 MG tablet Take 1 tablet (10 mg total) by mouth daily. 30 tablet 5  . meloxicam (MOBIC) 7.5 MG tablet Take 1 tablet (7.5 mg total) by mouth daily as needed for pain. 30 tablet 5  . omeprazole (PRILOSEC) 20 MG capsule Take 20 mg by mouth daily.     No current facility-administered medications on file prior to visit.    BP 142/78 mmHg  Pulse 66  Temp(Src) 97.8 F (36.6 C) (Oral)  Resp 16  Ht 5\' 8"  (1.727 m)  Wt 118 lb 9.6 oz (53.797 kg)  BMI 18.04 kg/m2       Objective:   Physical Exam  Constitutional: He is oriented to person, place, and time. He appears well-developed and well-nourished. No distress.  Neurological: He is alert and oriented to person, place, and time.  Psychiatric: His behavior is normal. Judgment and thought content normal.  Slightly flat affect          Assessment & Plan:

## 2015-01-20 NOTE — Progress Notes (Signed)
Pre visit review using our clinic review tool, if applicable. No additional management support is needed unless otherwise documented below in the visit note. 

## 2015-01-23 ENCOUNTER — Ambulatory Visit (INDEPENDENT_AMBULATORY_CARE_PROVIDER_SITE_OTHER): Payer: PPO | Admitting: Endocrinology

## 2015-01-23 ENCOUNTER — Encounter: Payer: Self-pay | Admitting: Endocrinology

## 2015-01-23 VITALS — BP 120/80 | HR 66 | Temp 97.6°F | Resp 16 | Ht 68.5 in | Wt 115.0 lb

## 2015-01-23 DIAGNOSIS — E222 Syndrome of inappropriate secretion of antidiuretic hormone: Secondary | ICD-10-CM | POA: Diagnosis not present

## 2015-01-23 DIAGNOSIS — R634 Abnormal weight loss: Secondary | ICD-10-CM

## 2015-01-23 LAB — BASIC METABOLIC PANEL
BUN: 11 mg/dL (ref 6–23)
CO2: 27 mEq/L (ref 19–32)
Calcium: 9.8 mg/dL (ref 8.4–10.5)
Chloride: 94 mEq/L — ABNORMAL LOW (ref 96–112)
Creatinine, Ser: 0.83 mg/dL (ref 0.40–1.50)
GFR: 98.11 mL/min (ref >60.00)
GLUCOSE: 75 mg/dL (ref 70–99)
Potassium: 4.8 mEq/L (ref 3.5–5.1)
Sodium: 128 mEq/L — ABNORMAL LOW (ref 135–145)

## 2015-01-23 LAB — T4, FREE: Free T4: 0.74 ng/dL (ref 0.60–1.60)

## 2015-01-23 NOTE — Progress Notes (Signed)
Patient ID: Blake Little, male   DOB: November 27, 1945, 69 y.o.   MRN: [id number]   Chief complaint: Low sodium  Referring physician: [name]  History of Present Illness:  The patient was noticed to have a sodium of 127 in April Previously had been over 130.  He is relatively new to the practice as of this year and previous levels are not available On Prozac 3 years or so for anxiety and depression but he is not clear about this Does not think the dose has been changed recently Recently has been started on Effexor He has not been on any diuretics  The patient says he is constantly drinking water and may drink about 48 ounces a day.  He also has 4-5 cups of coffee daily.  The patient came in today with a cup of coffee in his hand  No complaints of feeling nausea, drowsiness, weakness or change in appetite He occasionally will take naps but does not feel sleepy   Lab Results  Component Value Date   CREATININE 0.79 12/05/2014   BUN 6 12/05/2014   NA 127* 12/05/2014   K 5.0 12/05/2014   CL 97 12/05/2014   CO2 25 12/05/2014     Past Medical History  Diagnosis Date  . Depression   . GERD (gastroesophageal reflux disease)   . History of bleeding ulcers 1986    hospitalized    Past Surgical History  Procedure Laterality Date  . Stomach surgery  1986    due to ulcers, pt unsure of procedure    Family History  Problem Relation Age of Onset  . COPD Mother   . Heart attack Father     Social History:  reports that he has been smoking Cigarettes.  He has a 39.75 pack-year smoking history. He does not have any smokeless tobacco history on file. He reports that he uses illicit drugs. He reports that he does not drink alcohol.  Allergies:  Allergies  Allergen Reactions  . Asa [Aspirin]     Abdominal pain      Medication List       This list is accurate as of: 01/23/15  3:35 PM.  Always use your most recent med list.               acetaminophen  650 MG CR tablet  Commonly known as:  TYLENOL  Take 650 mg by mouth every 8 (eight) hours as needed for pain.     ALPRAZolam 1 MG tablet  Commonly known as:  XANAX  Take 1 mg by mouth every 6 (six) hours as needed for anxiety.     buPROPion 150 MG 12 hr tablet  Commonly known as:  WELLBUTRIN SR  Initial: 150 mg once daily for 3 days; increase to 150 mg twice daily;     FLUoxetine 40 MG capsule  Commonly known as:  PROZAC  Take 1 capsule (40 mg total) by mouth daily.     lisinopril 10 MG tablet  Commonly known as:  PRINIVIL,ZESTRIL  Take 1 tablet (10 mg total) by mouth daily.     meloxicam 7.5 MG tablet  Commonly known as:  MOBIC  Take 1 tablet (7.5 mg total) by mouth daily as needed for pain.     omeprazole 20 MG capsule  Commonly known as:  PRILOSEC  Take 20 mg by mouth daily.     venlafaxine XR 75 MG 24 hr capsule  Commonly known as:  EFFEXOR XR  Take  1 capsule (75 mg total) by mouth daily with breakfast.        LABS:  No visits with results within 1 Week(s) from this visit. Latest known visit with results is:  Lab on 12/22/2014  Component Date Value Ref Range Status  . Cortisol - AM 12/22/2014 21.4  4.3 - 22.4 ug/dL Final     REVIEW OF SYSTEMS:        Constitutional: complaints of low appetite  Has had more fatigue recently and he does not know why.  He has lost some weight recently also He says he feels like his legs get tired after walking  Wt Readings from Last 3 Encounters:  01/23/15 115 lb (52.164 kg)  01/20/15 118 lb 9.6 oz (53.797 kg)  01/04/15 116 lb (52.617 kg)    Eyes: no history of blurred vision   ENT: no difficulty swallowing, no hoarseness   Cardiovascular: no chest pain or tightness on exertion.  No leg swelling.  On lisinopril for a history of high blood pressure  Respiratory: no cough Has shortness of breath on exertion and has been told to have emphysema Also his chest CT scan shows multiple small nodules and is going to have  another CT scan in follow-up to evaluate one of the larger nodules  Gastrointestinal: no constipation, diarrhea or abdominal pain  Musculoskeletal: no muscle/joint aches Skin: no rash  Neurological: no headaches, numbness or tingling in hands, only rarely may feel a little numbness in his feet  Psychiatric  Depression/anxiety present; He has been feeling stressed also.  On multiple antidepressants currently, recently seen by PCP  Endocrine: No history of thyroid disease, does have fatigue    Urological:   No frequency of urination or excessive nocturia    PHYSICAL EXAM:  BP 120/80 mmHg  Pulse 66  Temp(Src) 97.6 F (36.4 C) (Oral)  Resp 16  Ht 5' 8.5" (1.74 m)  Wt 115 lb (52.164 kg)  BMI 17.23 kg/m2  SpO2 99%  GENERAL: He is relatively asthenic looking  No pallor, clubbing, lymphadenopathy or edema.   Skin:  no rash or pigmentation.  EYES:  Externally normal.  Fundii:  normal discs and vessels.  ENT: Oral mucosa and tongue normal.  Mild dryness of tongue present with a little coating  THYROID:  Not palpable.  HEART:  Normal  S1 and S2; no murmur or click.  CHEST:  Normal shape.  Lungs: Vescicular breath sounds heard equally.  No crepitations/ wheeze.  ABDOMEN:  No distention.  Liver and spleen not palpable.  No other mass or tenderness.  NEUROLOGICAL: .Reflexes are bilaterally normal at biceps and ankles.  JOINTS:  Normal.  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    PLAN:   Repeat sodium today Instructed him on  gradually decreasing his fluid intake and he should try to avoid drinking more than 5 cups of fluids a day including water and coffee If no other etiology is found for his weight loss may consider Cortrosyn stimulation test even though  his cortisol level was high normal fasting Also will check his free T4 level to rule out secondary hypothyroidism  Will also have him come back in 6 weeks for follow-up  Will forward this information to PCP and recommend that his Prozac be tapered off while using other antidepressants  Jayon Matton 01/23/2015, 3:35 PM   Addendum: Sodium 128, needs fluid restriction and change in his SSRI treatment for improved control

## 2015-01-23 NOTE — Patient Instructions (Signed)
Reduce fluids by 1 cup daily on weekly basis to max of 5 cups per day

## 2015-01-24 NOTE — Progress Notes (Signed)
Quick Note:  Please let patient know that the sodium is still low, needs to cut back on fluids as discussed and have informed PCP about tapering off his Prozac ______

## 2015-02-09 ENCOUNTER — Ambulatory Visit: Payer: PPO | Admitting: Pulmonary Disease

## 2015-02-14 ENCOUNTER — Encounter: Payer: Self-pay | Admitting: Behavioral Health

## 2015-02-14 ENCOUNTER — Telehealth: Payer: Self-pay | Admitting: Behavioral Health

## 2015-02-14 NOTE — Telephone Encounter (Signed)
Pre-Visit Call completed with patient and chart updated.   Pre-Visit Info documented in Specialty Comments under [employer].    

## 2015-02-15 ENCOUNTER — Ambulatory Visit (INDEPENDENT_AMBULATORY_CARE_PROVIDER_SITE_OTHER): Payer: PPO | Admitting: Family

## 2015-02-15 ENCOUNTER — Ambulatory Visit: Payer: Self-pay | Admitting: Family

## 2015-02-15 ENCOUNTER — Encounter: Payer: Self-pay | Admitting: Family

## 2015-02-15 VITALS — BP 146/70 | HR 83 | Temp 97.9°F | Resp 16 | Ht 68.0 in | Wt 118.4 lb

## 2015-02-15 DIAGNOSIS — F32A Depression, unspecified: Secondary | ICD-10-CM

## 2015-02-15 DIAGNOSIS — F418 Other specified anxiety disorders: Secondary | ICD-10-CM

## 2015-02-15 DIAGNOSIS — F329 Major depressive disorder, single episode, unspecified: Secondary | ICD-10-CM

## 2015-02-15 DIAGNOSIS — F419 Anxiety disorder, unspecified: Principal | ICD-10-CM

## 2015-02-15 MED ORDER — VENLAFAXINE HCL ER 150 MG PO CP24: 150.0000 mg | ORAL_CAPSULE | Freq: Every day | ORAL | Status: DC

## 2015-02-15 NOTE — Progress Notes (Signed)
Subjective:    Patient ID: Blake Little, male    DOB: 26-Jan-1946, 69 y.o.   MRN: [id number]  HPI  Mr. Blake Little is a 69 yr old male who presents today for follow up on anxiety/depression.   No improvement in depression since we increased effexor and prozac.  Reports that he has panic attacks at times.  He is not sleeping well.   Napping at times. He continues buproprion.  He reports that he is attending AA meetings.  Declines to see a therapist. Denies SI/HI.   Review of Systems See HPI  Past Medical History  Diagnosis Date  . Depression   . GERD (gastroesophageal reflux disease)   . History of bleeding ulcers 1986    hospitalized    History   Social History  . Marital Status: Divorced    Spouse Name: N/A  . Number of Children: N/A  . Years of Education: N/A   Occupational History  . Not on file.   Social History Main Topics  . Smoking status: Current Every Day Smoker -- 0.75 packs/day for 53 years    Types: Cigarettes  . Smokeless tobacco: Not on file  . Alcohol Use: No  . Drug Use: Yes     Comment: marijuana   . Sexual Activity: Not on file   Other Topics Concern  . Not on file   Social History Narrative   In Divorce   3 sons- youngest son in [place], other sons live locally   1 daughter- lives locally   Brother and sisters live locally (1 of 7 children)   Retired Cabin crewtrain driver    Completed HS and technically schools   Enjoys golf/outside activities       Past Surgical History  Procedure Laterality Date  . Stomach surgery  1986    due to ulcers, pt unsure of procedure    Family History  Problem Relation Age of Onset  . COPD Mother   . Heart attack Father     Allergies  Allergen Reactions  . Asa [Aspirin]     Abdominal pain    Current Outpatient Prescriptions on File Prior to Visit  Medication Sig Dispense Refill  . acetaminophen (TYLENOL) 650 MG CR tablet Take 650 mg by mouth every 8 (eight) hours as needed for pain.    Marland Kitchen. buPROPion (WELLBUTRIN  SR) 150 MG 12 hr tablet Initial: 150 mg once daily for 3 days; increase to 150 mg twice daily; 60 tablet 2  . FLUoxetine (PROZAC) 40 MG capsule Take 1 capsule (40 mg total) by mouth daily. 30 capsule 5  . lisinopril (PRINIVIL,ZESTRIL) 10 MG tablet Take 1 tablet (10 mg total) by mouth daily. 30 tablet 5  . meloxicam (MOBIC) 7.5 MG tablet Take 1 tablet (7.5 mg total) by mouth daily as needed for pain. 30 tablet 5  . omeprazole (PRILOSEC) 20 MG capsule Take 20 mg by mouth daily.     No current facility-administered medications on file prior to visit.    BP 146/70 mmHg  Pulse 83  Temp(Src) 97.9 F (36.6 C) (Oral)  Resp 16  Ht 5\' 8"  (1.727 m)  Wt 118 lb 6.4 oz (53.706 kg)  BMI 18.01 kg/m2  SpO2 96%       Objective:   Physical Exam  Constitutional: He appears well-developed and well-nourished.  Psychiatric: His behavior is normal. Judgment and thought content normal.  Flat affect noted          Assessment & Plan:

## 2015-02-15 NOTE — Patient Instructions (Signed)
Increase effexor to  once daily. Follow up in 6 weeks.

## 2015-02-15 NOTE — Progress Notes (Signed)
Pre visit review using our clinic review tool, if applicable. No additional management support is needed unless otherwise documented below in the visit note. 

## 2015-02-15 NOTE — Assessment & Plan Note (Signed)
Uncontrolled. Declines referral to a therapist. Increase effexor. Continue current dose of wellbutrin and prozac.  He again inquires re: benzo rx. Admits to ongoing marijuana use. Advised pt that I cannot provide him with benzos as long as he continues to use marijuana.  He verbalizes understanding.

## 2015-02-21 ENCOUNTER — Telehealth: Payer: Self-pay | Admitting: Acute Care

## 2015-02-21 NOTE — Telephone Encounter (Signed)
I called and left a message on Mr. Blake Little answering machine. I left my contact info and asked him to return my call so that we could get his follow up scan scheduled. I will await his return call.

## 2015-02-23 ENCOUNTER — Telehealth: Payer: Self-pay | Admitting: Acute Care

## 2015-02-23 NOTE — Telephone Encounter (Signed)
I was able to speak with Mr. Blake Little today about scheduling his repeat LDCT as a follow up for the 4 A Lung RADS. He is [facility] about when he is having a biopsy of his prostate, before we can schedule the CT. We planned on me following up with him tomorrow to schedule. He will call Melissa's office today to determine when his appointment for the biopsy is scheduled.

## 2015-02-24 ENCOUNTER — Telehealth: Payer: Self-pay | Admitting: Acute Care

## 2015-02-24 ENCOUNTER — Other Ambulatory Visit: Payer: Self-pay | Admitting: Acute Care

## 2015-02-24 DIAGNOSIS — F17210 Nicotine dependence, cigarettes, uncomplicated: Secondary | ICD-10-CM

## 2015-02-24 NOTE — Telephone Encounter (Signed)
I called Mr. Blake Little back today as promised. He has his prostate biopsy scheduled for 03/01/15. We have scheduled him for his follow up lung cancer screening on 03/14/15 at 11 am. [facility].I will confirm time with the patient once I have had the opportunity to speak with the [facility] staff.

## 2015-03-01 ENCOUNTER — Other Ambulatory Visit: Payer: PPO

## 2015-03-03 ENCOUNTER — Ambulatory Visit: Payer: PPO | Admitting: Family

## 2015-03-06 ENCOUNTER — Ambulatory Visit: Payer: PPO | Admitting: Endocrinology

## 2015-03-14 ENCOUNTER — Ambulatory Visit (HOSPITAL_BASED_OUTPATIENT_CLINIC_OR_DEPARTMENT_OTHER): Payer: PPO

## 2015-03-16 ENCOUNTER — Telehealth: Payer: Self-pay | Admitting: Acute Care

## 2015-03-16 NOTE — Telephone Encounter (Signed)
I called and left a message for Blake Little to call me to reschedule the follow up scan he cancelled on 03/10/15 through the [facility] Scheduling desk. The patient cancelled his scan per Karna Christmas ( scheduling desk). I will await his return call.

## 2015-03-17 ENCOUNTER — Telehealth: Payer: Self-pay | Admitting: Acute Care

## 2015-03-17 NOTE — Telephone Encounter (Signed)
Blake Little did not call back within the time frame he had indicated for information regarding a lung cancer screening program in or near [facility] where he will be for many months. I called and left him a message on his cell phone with my contact information, asking him to return my call so that I can refer him to a screening program in [place]. While writing this note he returned my call. I will call the primary care doctor there and ask them to schedule his follow up.( Dr. Geanie Kenning, [phone], [facility])

## 2015-03-17 NOTE — Telephone Encounter (Signed)
I called Blake Little back today. He informed me that he was going to be in [place] for an indeterminate amount of time.( Several months)  I explained to him that it was important that he has this 3 month follow up scan done so that we can make sure we recheck the lung nodule that was concerning in the previous scan. I have asked him to call me with the name of his health care provider in [place] so he can be referred to a Lung Cancer screening program there and get his 3 month follow up scan in the Lung Rads format to allow for comparison. I am awaiting his return call.He verbalized understanding of the importance of getting the 3 month follow up scan.

## 2015-03-17 NOTE — Telephone Encounter (Signed)
I am getting the patient referred to [facility] for his follow up scan as the patient will be in [place]. For an indeterminate time, and needs this follow up scan. I will start the process of getting this approved through his medicare plan at a different site than it had been approved for on 03/14/15. Blake Little knows it will take a week or two, and has my contact information in the event he has any questions.

## 2015-03-22 ENCOUNTER — Encounter: Payer: Self-pay | Admitting: Family

## 2015-03-27 ENCOUNTER — Telehealth: Payer: Self-pay | Admitting: Acute Care

## 2015-03-27 NOTE — Telephone Encounter (Signed)
I have called Blake Little to let him know that I have found a facility in [place], [place] who can do the lung cancer screening follow up scan ([facility]) . I have called his insurance [facility] ), and they said that the payment would be at 70% instead of 80% due to the fact the radiology facility in [place]. is out of network. The cost of the scan in [place] is $707.00. His out of pocket cost will be $212.00. I have told him this, and also told him that this scan needs to be done, since his previous scan was abnormal, and we need to recheck the abnormal nodule. He has no idea when he will return to [facility] from [place]. He anticipates that it will be months. He wants to check with his primary physician in [place] before he has me schedule the follow up scan with the radiology group in [place].He has told me he will call me back and let me know if he wants me to schedule the scan in [place]. I emphasized again how important it is to his health to have the repeat scan done as soon as possible, since it has been 3 months since the first scan was done. He verbalized understanding.He verbalized that he would call me back with how he wants me to proceed.I will await his return call.

## 2015-03-31 ENCOUNTER — Telehealth: Payer: Self-pay | Admitting: Acute Care

## 2015-03-31 NOTE — Telephone Encounter (Signed)
I did not get a return call from Mr. Markos, so I have called him today. He said he saw a Dr. Geanie Kenning in [facility] yesterday, and that they wanted me to call their office to set up the scan. I will call the office once they reopen from lunch, and see if they will schedule the scan.

## 2015-04-04 ENCOUNTER — Telehealth: Payer: Self-pay | Admitting: Acute Care

## 2015-04-04 NOTE — Telephone Encounter (Signed)
I received a call from Mr. Blake Little PCP in [place].( Dr. Geanie Kenning [phone])  She informed me that they do not do the screening CT's in their office. I have called Blake Little and told him this. I have found an alternate office in [place] that will do the scan. He is aware that it is out of network and that the scan will cost him approximately $212.00 to have it done in [place]. He verbalized understanding of this. I reminded him that his scan done in April was abnormal, and that it was important that he has the scan done to make sure the nodule of concern has cleared . He verbalized understanding. He has given me permission to fax an order for the scan along with his insurance information and health history to the [facility]. I will fax the information today. I have called the office in [place] and they will schedule him for his scan. They have his contact information. He verbalized understanding that they will call and schedule him. I have also given him the number for the center and told him that if he has not heard from them in 2 days, to call them and make the appointment. He verbalized understanding and has said he will do this. I have requested that the [facility] call/ fax me the results of the scan.They have my phone and fax numbers.

## 2015-04-11 ENCOUNTER — Telehealth: Payer: Self-pay | Admitting: Acute Care

## 2015-04-11 NOTE — Telephone Encounter (Signed)
I called to make sure Blake Little was scheduled for his follow up screening CT in [place]. He is schedule for 04/13/15 in [place]. They will fax me the results of the scan.

## 2015-04-16 ENCOUNTER — Other Ambulatory Visit: Payer: Self-pay | Admitting: Family

## 2015-04-18 NOTE — Telephone Encounter (Signed)
Blake Little-- please advise re: effexor refill. Pt last seen by [place] 02/2015 and phone notes with pulmonology from 03/2015 indicate that pt will be in [place] for an indeterminate amount of time and has a PCP there.  Please advise?

## 2015-04-20 ENCOUNTER — Telehealth: Payer: Self-pay | Admitting: Pulmonary Disease

## 2015-04-21 ENCOUNTER — Telehealth: Payer: Self-pay | Admitting: Acute Care

## 2015-04-21 NOTE — Telephone Encounter (Signed)
Blake Little has given permission to send a copy of the previous CT scan results ( 12/08/14) to [facility] for comparison to the scan he had done there this week as follow up to the April scan he had done here in [place].

## 2015-04-21 NOTE — Telephone Encounter (Signed)
Spoke with Kandice Robinsons as she has been the one working with them on pt case to have CT done. Will forward message over to her

## 2015-05-09 ENCOUNTER — Telehealth: Payer: Self-pay | Admitting: Acute Care

## 2015-05-09 NOTE — Telephone Encounter (Signed)
I called to give Blake Little the results of his CT scan.There was no answer. I left a message with my contact information requesting that he return my call. I will await his return call.

## 2015-05-10 ENCOUNTER — Telehealth: Payer: Self-pay | Admitting: Acute Care

## 2015-05-10 NOTE — Telephone Encounter (Signed)
I called Mr. Mastrangelo again. I explained that the results of his CT ( Done at [facility]) indicated that he should have a follow up scan in 6 months. I explained that the radiologist specified that she wanted to follow up on the areas of pleural based probable fibrotic changes in the posterior aspect of the right upper lobe. I also asked Mr. Yom if he was going to be living in [place] in March, when the next scan is due. He stated he would be in [place].I have told him he needs to have the primary care physician he sees in [place] ( Dr. Geanie Kenning at [facility]) assume the responsibility for ordering the scans from this point on.He verbalized understanding of this. I have told him to have his primary care doctor contact us for any medical records they need.He verbalized understanding. He verbalized to me that he understands that he needs to have a follow up scan in 6 months. I will fax Dr. Geanie Kenning to make sure she knows that Mr. Nordquist is due for his next scan March of 2017, and since she is the patient's PCP in [place] she will need to assume care for the screening at this point.He verbalized understanding of all of the above.

## 2015-05-12 ENCOUNTER — Telehealth: Payer: Self-pay | Admitting: Acute Care

## 2015-05-12 NOTE — Telephone Encounter (Signed)
I have faxed the [facility], specifically Dr. Geanie Kenning, who is Blake Little established PCP in [place]. We have been following Blake Little for his lung cancer screening, and as he has moved out of state I am sharing the previous scan results  and recommended schedule for a follow up CT in 6 months ( March 2017) with Dr. Geanie Kenning .This will  facilitate her ability to schedule  and monitor  all future screening CT's for Blake Little. I have faxed the previous scan reports and a letter asking Dr. Geanie Kenning, as Blake Little PCP, to manage his lung cancer screening form this point on. I faxed the information today, 05/12/15, and called the office to confirm receipt of the letter and scan reports. They received the fax, and let me know they would get the information to Dr. Geanie Kenning. I have also spoken with Blake Little. I gave him the results of his last CT dated 04/20/15, and told him he would need another CT in 6 months to follow a right upper lobe nodule. He told me he was not moving back to [place], and asked me to send the information to Dr. Geanie Kenning, his PCP in [place]. He verbalized understanding of the fact that he needs a follow up CT in 6 months, and that I will send the information to his PCP. I told him to follow up with Dr. Geanie Kenning to make sure the scan is scheduled for March. He verbalized understanding of this also. Within the faxed information sent to Dr. Geanie Kenning, I gave  all of my contact information in the event she has any questions to allow seamless transfer of care.

## 2017-02-06 ENCOUNTER — Encounter (HOSPITAL_COMMUNITY): Payer: Self-pay

## 2017-02-06 ENCOUNTER — Inpatient Hospital Stay (HOSPITAL_COMMUNITY)
Admission: EM | Admit: 2017-02-06 | Discharge: 2017-02-11 | DRG: 101 | Disposition: A | Discharge status: Home / Self Care | Payer: MEDICARE | Attending: Internal Medicine | Admitting: Internal Medicine

## 2017-02-06 ENCOUNTER — Emergency Department (HOSPITAL_COMMUNITY): Payer: Self-pay

## 2017-02-06 ENCOUNTER — Observation Stay (HOSPITAL_COMMUNITY): Payer: Self-pay

## 2017-02-06 DIAGNOSIS — R402142 Coma scale, eyes open, spontaneous, at arrival to emergency department: Secondary | ICD-10-CM | POA: Diagnosis present

## 2017-02-06 DIAGNOSIS — K228 Other specified diseases of esophagus: Secondary | ICD-10-CM

## 2017-02-06 DIAGNOSIS — Z87898 Personal history of other specified conditions: Secondary | ICD-10-CM

## 2017-02-06 DIAGNOSIS — F1011 Alcohol abuse, in remission: Secondary | ICD-10-CM

## 2017-02-06 DIAGNOSIS — E876 Hypokalemia: Secondary | ICD-10-CM | POA: Diagnosis not present

## 2017-02-06 DIAGNOSIS — F1010 Alcohol abuse, uncomplicated: Secondary | ICD-10-CM

## 2017-02-06 DIAGNOSIS — R479 Unspecified speech disturbances: Secondary | ICD-10-CM

## 2017-02-06 DIAGNOSIS — E079 Disorder of thyroid, unspecified: Secondary | ICD-10-CM | POA: Diagnosis present

## 2017-02-06 DIAGNOSIS — Z886 Allergy status to analgesic agent status: Secondary | ICD-10-CM

## 2017-02-06 DIAGNOSIS — E860 Dehydration: Secondary | ICD-10-CM | POA: Diagnosis present

## 2017-02-06 DIAGNOSIS — K219 Gastro-esophageal reflux disease without esophagitis: Secondary | ICD-10-CM | POA: Diagnosis present

## 2017-02-06 DIAGNOSIS — Z8249 Family history of ischemic heart disease and other diseases of the circulatory system: Secondary | ICD-10-CM

## 2017-02-06 DIAGNOSIS — E871 Hypo-osmolality and hyponatremia: Secondary | ICD-10-CM | POA: Diagnosis present

## 2017-02-06 DIAGNOSIS — F32A Depression, unspecified: Secondary | ICD-10-CM | POA: Diagnosis present

## 2017-02-06 DIAGNOSIS — G959 Disease of spinal cord, unspecified: Secondary | ICD-10-CM

## 2017-02-06 DIAGNOSIS — Z825 Family history of asthma and other chronic lower respiratory diseases: Secondary | ICD-10-CM

## 2017-02-06 DIAGNOSIS — R402352 Coma scale, best motor response, localizes pain, at arrival to emergency department: Secondary | ICD-10-CM | POA: Diagnosis present

## 2017-02-06 DIAGNOSIS — G40909 Epilepsy, unspecified, not intractable, without status epilepticus: Secondary | ICD-10-CM

## 2017-02-06 DIAGNOSIS — Z720 Tobacco use: Secondary | ICD-10-CM | POA: Diagnosis present

## 2017-02-06 DIAGNOSIS — F17210 Nicotine dependence, cigarettes, uncomplicated: Secondary | ICD-10-CM | POA: Diagnosis present

## 2017-02-06 DIAGNOSIS — G9341 Metabolic encephalopathy: Secondary | ICD-10-CM | POA: Diagnosis present

## 2017-02-06 DIAGNOSIS — Z23 Encounter for immunization: Secondary | ICD-10-CM

## 2017-02-06 DIAGNOSIS — W19XXXA Unspecified fall, initial encounter: Secondary | ICD-10-CM | POA: Diagnosis present

## 2017-02-06 DIAGNOSIS — F13239 Sedative, hypnotic or anxiolytic dependence with withdrawal, unspecified: Secondary | ICD-10-CM | POA: Diagnosis present

## 2017-02-06 DIAGNOSIS — G40901 Epilepsy, unspecified, not intractable, with status epilepticus: Principal | ICD-10-CM | POA: Diagnosis present

## 2017-02-06 DIAGNOSIS — I10 Essential (primary) hypertension: Secondary | ICD-10-CM | POA: Diagnosis present

## 2017-02-06 DIAGNOSIS — K2289 Other specified disease of esophagus: Secondary | ICD-10-CM

## 2017-02-06 DIAGNOSIS — F4310 Post-traumatic stress disorder, unspecified: Secondary | ICD-10-CM | POA: Diagnosis present

## 2017-02-06 DIAGNOSIS — Z8261 Family history of arthritis: Secondary | ICD-10-CM

## 2017-02-06 DIAGNOSIS — F329 Major depressive disorder, single episode, unspecified: Secondary | ICD-10-CM | POA: Diagnosis present

## 2017-02-06 DIAGNOSIS — S0011XA Contusion of right eyelid and periocular area, initial encounter: Secondary | ICD-10-CM | POA: Diagnosis present

## 2017-02-06 DIAGNOSIS — F411 Generalized anxiety disorder: Secondary | ICD-10-CM | POA: Diagnosis present

## 2017-02-06 DIAGNOSIS — E785 Hyperlipidemia, unspecified: Secondary | ICD-10-CM | POA: Diagnosis present

## 2017-02-06 DIAGNOSIS — R402242 Coma scale, best verbal response, confused conversation, at arrival to emergency department: Secondary | ICD-10-CM | POA: Diagnosis present

## 2017-02-06 DIAGNOSIS — F410 Panic disorder [episodic paroxysmal anxiety] without agoraphobia: Secondary | ICD-10-CM | POA: Diagnosis present

## 2017-02-06 DIAGNOSIS — G9340 Encephalopathy, unspecified: Secondary | ICD-10-CM

## 2017-02-06 DIAGNOSIS — F419 Anxiety disorder, unspecified: Secondary | ICD-10-CM

## 2017-02-06 HISTORY — DX: Post-traumatic stress disorder, unspecified: F43.10

## 2017-02-06 LAB — CBG MONITORING, ED
GLUCOSE-CAPILLARY: 70 mg/dL (ref 65–99)
GLUCOSE-CAPILLARY: 209 mg/dL — AB (ref 65–99)

## 2017-02-06 LAB — COMPREHENSIVE METABOLIC PANEL
ALT: 17 U/L (ref 17–63)
AST: 31 U/L (ref 15–41)
Albumin: 3.5 g/dL (ref 3.5–5.0)
Alkaline Phosphatase: 75 U/L (ref 38–126)
Anion gap: 8 (ref 5–15)
BILIRUBIN TOTAL: 0.5 mg/dL (ref 0.3–1.2)
BUN: 11 mg/dL (ref 6–20)
CO2: 24 mmol/L (ref 22–32)
CREATININE: 1.04 mg/dL (ref 0.61–1.24)
Calcium: 9.3 mg/dL (ref 8.9–10.3)
Chloride: 97 mmol/L — ABNORMAL LOW (ref 101–111)
GFR calc Af Amer: >60 mL/min (ref >60)
Glucose, Bld: 91 mg/dL (ref 65–99)
POTASSIUM: 4.2 mmol/L (ref 3.5–5.1)
Sodium: 129 mmol/L — ABNORMAL LOW (ref 135–145)
TOTAL PROTEIN: 6.6 g/dL (ref 6.5–8.1)

## 2017-02-06 LAB — URINALYSIS, ROUTINE W REFLEX MICROSCOPIC
BILIRUBIN URINE: NEGATIVE
Glucose, UA: NEGATIVE mg/dL
HGB URINE DIPSTICK: NEGATIVE
Ketones, ur: 20 mg/dL — AB
Leukocytes, UA: NEGATIVE
Nitrite: NEGATIVE
PROTEIN: NEGATIVE mg/dL
Specific Gravity, Urine: 1.024 (ref 1.005–1.030)
pH: 5 (ref 5.0–8.0)

## 2017-02-06 LAB — CBC WITH DIFFERENTIAL/PLATELET
BASOS ABS: 0 10*3/uL (ref 0.0–0.1)
Basophils Relative: 0 %
Eosinophils Absolute: 0.1 10*3/uL (ref 0.0–0.7)
Eosinophils Relative: 1 %
HEMATOCRIT: 35.7 % — AB (ref 39.0–52.0)
Hemoglobin: 11.9 g/dL — ABNORMAL LOW (ref 13.0–17.0)
LYMPHS ABS: 1.2 10*3/uL (ref 0.7–4.0)
LYMPHS PCT: 17 %
MCH: 29.8 pg (ref 26.0–34.0)
MCHC: 33.3 g/dL (ref 30.0–36.0)
MCV: 89.3 fL (ref 78.0–100.0)
MONO ABS: 0.4 10*3/uL (ref 0.1–1.0)
Monocytes Relative: 6 %
NEUTROS ABS: 5.3 10*3/uL (ref 1.7–7.7)
Neutrophils Relative %: 76 %
Platelets: 323 10*3/uL (ref 150–400)
RBC: 4 MIL/uL — ABNORMAL LOW (ref 4.22–5.81)
RDW: 14.8 % (ref 11.5–15.5)
WBC: 7 10*3/uL (ref 4.0–10.5)

## 2017-02-06 LAB — SODIUM, URINE, RANDOM: SODIUM UR: 81 mmol/L

## 2017-02-06 LAB — ETHANOL

## 2017-02-06 LAB — CK: CK TOTAL: 427 U/L — AB (ref 49–397)

## 2017-02-06 LAB — RAPID URINE DRUG SCREEN, HOSP PERFORMED
Amphetamines: NOT DETECTED
Barbiturates: NOT DETECTED
Benzodiazepines: POSITIVE — AB
Cocaine: NOT DETECTED
Opiates: NOT DETECTED
Tetrahydrocannabinol: POSITIVE — AB

## 2017-02-06 LAB — OSMOLALITY, URINE: Osmolality, Ur: 644 mOsm/kg (ref 300–900)

## 2017-02-06 LAB — AMMONIA: AMMONIA: 16 umol/L (ref 9–35)

## 2017-02-06 MED ORDER — LORAZEPAM 2 MG/ML IJ SOLN
0.5000 mg | Freq: Three times a day (TID) | INTRAMUSCULAR | Status: DC | PRN
  Administered 2017-02-07: 0.5 mg via INTRAVENOUS
  Filled 2017-02-06 (×2): qty 1

## 2017-02-06 MED ORDER — SODIUM CHLORIDE 0.9 % IV SOLN
INTRAVENOUS | Status: DC
  Administered 2017-02-07 – 2017-02-09 (×5): via INTRAVENOUS

## 2017-02-06 MED ORDER — ONDANSETRON HCL 4 MG/2ML IJ SOLN: 4.0000 mg | Freq: Four times a day (QID) | INTRAMUSCULAR | Status: DC | PRN

## 2017-02-06 MED ORDER — ONDANSETRON HCL 4 MG PO TABS: 4.0000 mg | ORAL_TABLET | Freq: Four times a day (QID) | ORAL | Status: DC | PRN

## 2017-02-06 MED ORDER — HYDRALAZINE HCL 20 MG/ML IJ SOLN: 5.0000 mg | INTRAMUSCULAR | Status: DC | PRN

## 2017-02-06 MED ORDER — ACETAMINOPHEN 650 MG RE SUPP
650.0000 mg | Freq: Four times a day (QID) | RECTAL | Status: DC | PRN
Start: 2017-02-06 — End: 2017-02-11
  Administered 2017-02-08: 650 mg via RECTAL
  Filled 2017-02-06: qty 1

## 2017-02-06 MED ORDER — SODIUM CHLORIDE 0.9% FLUSH
3.0000 mL | Freq: Two times a day (BID) | INTRAVENOUS | Status: DC
  Administered 2017-02-07 (×2): 3 mL via INTRAVENOUS
  Administered 2017-02-08: 02:00:00 via INTRAVENOUS
  Administered 2017-02-08 – 2017-02-11 (×7): 3 mL via INTRAVENOUS

## 2017-02-06 MED ORDER — ZOLPIDEM TARTRATE 5 MG PO TABS
5.0000 mg | ORAL_TABLET | Freq: Every evening | ORAL | Status: DC | PRN
Start: 2017-02-06 — End: 2017-02-11
  Administered 2017-02-07 – 2017-02-10 (×2): 5 mg via ORAL
  Filled 2017-02-06 (×2): qty 1

## 2017-02-06 MED ORDER — ENOXAPARIN SODIUM 40 MG/0.4ML ~~LOC~~ SOLN
40.0000 mg | SUBCUTANEOUS | Status: DC
  Administered 2017-02-07 – 2017-02-11 (×5): 40 mg via SUBCUTANEOUS
  Filled 2017-02-06 (×5): qty 0.4

## 2017-02-06 MED ORDER — NICOTINE 21 MG/24HR TD PT24
21.0000 mg | MEDICATED_PATCH | Freq: Every day | TRANSDERMAL | Status: DC
  Administered 2017-02-07 – 2017-02-11 (×6): 21 mg via TRANSDERMAL
  Filled 2017-02-06 (×5): qty 1

## 2017-02-06 MED ORDER — STROKE: EARLY STAGES OF RECOVERY BOOK
Freq: Once | Status: DC
  Filled 2017-02-06: qty 1

## 2017-02-06 MED ORDER — LORAZEPAM 2 MG/ML IJ SOLN: 1.0000 mg | INTRAMUSCULAR | Status: DC | PRN

## 2017-02-06 MED ORDER — LORAZEPAM 2 MG/ML IJ SOLN
1.0000 mg | Freq: Once | INTRAMUSCULAR | Status: AC
  Administered 2017-02-06: 1 mg via INTRAVENOUS

## 2017-02-06 MED ORDER — ACETAMINOPHEN 325 MG PO TABS: 650.0000 mg | ORAL_TABLET | Freq: Four times a day (QID) | ORAL | Status: DC | PRN

## 2017-02-06 MED ORDER — PANTOPRAZOLE SODIUM 40 MG PO TBEC
40.0000 mg | DELAYED_RELEASE_TABLET | Freq: Every day | ORAL | Status: DC | PRN
  Administered 2017-02-08: 40 mg via ORAL
  Filled 2017-02-06: qty 1

## 2017-02-06 NOTE — ED Notes (Signed)
Per main lab, will add on CK.

## 2017-02-06 NOTE — ED Notes (Signed)
ED Provider at bedside. 

## 2017-02-06 NOTE — ED Triage Notes (Signed)
Pt from home via [facility] for chief complaint of "confusion" that began yesterday afternoon at approx 4 PM. Per pt, pt sustained a fall yesterday with head injury and has been confused since. Pt A&Ox4, answers questions appropriately, reports he is staying at his son's house while his son on vacation and "feels like he can't find what he is looking for". Pt reports he is on a blood thinner but "doesn't know what kind or what reason it is prescribed/" Pt with hx of PTSD and per EMS when asked about his PTSD, pt zoned out and would not respond to further questions until the subject was changed. 140/64, 70 bpm, 16 RR, 145 CBG. NAD noted.

## 2017-02-06 NOTE — ED Notes (Signed)
Pt transported to CT ?

## 2017-02-06 NOTE — ED Notes (Signed)
Updated Dr. Clyde LundborgNiu on pt condition

## 2017-02-06 NOTE — ED Provider Notes (Addendum)
Medical screening examination/treatment/procedure(s) were conducted as a shared visit with non-physician practitioner(s) and myself.  I personally evaluated the patient during the encounter.   EKG Interpretation None      71 year old male who presents with AMS. History provided by patient's daughter. History of PUD, anxiety depression. Recently moved in [place] live to [place] 2 [place] ago from [place]. Was abruptly discontinued on his Xanax and Ambient and since then with confusion, agitation and possible fall. Patient Is oriented to self, location, and time, but not oriented to situation. He is unable to provide history. Has no focal neurological deficits. Does have bruising around the right eye likely from recent fall. Afebrile with stable vital signs. CT head shows no acute intracranial injuries. Blood work overall reassuring. UA without infection. Suspect delirium from likely withdrawal from Xanax and Ambien. Given altered mental status admitted to hospitalist service under Dr. Clyde LundborgNiu.   Lavera GuiseLiu, Oda Lansdowne Duo, MD 02/06/17 [id number]   After patient admitted, patient ordered MRI brain by admitting physician. In radiology, he had a "code event." by report, he had witnessed convulsions, but did not stop breathing and did not lose pulses. I saw patient as he was rolled back to ED. He is somnolent. He was incontinent of urine and had bite on tongue. Gradually becoming more awake and answering questions. Seems consistent with seizure and post-ictal period. Suspect withdrawal seizure from coming off of xanax. Given dose of ativan in the ED. Dr. Clyde LundborgNiu to change bed request to stepdown.   Lavera GuiseLiu, Taunya Goral Duo, MD 02/07/17 [id number]    Lavera GuiseLiu, Trevaun Rendleman Duo, MD 02/07/17 [phone]

## 2017-02-06 NOTE — ED Notes (Signed)
While assessing pt, pt stopped responding to this nurse's questions and began staring off. Pt appeared to understand this nurse's questions and would begin to open his mouth to speak but would not respond. At one point during assessment, pt slid out of the bottom of the bed onto the floor and when asked what he was doing pt states "I'm just moving closer, I'm gonna move closer". Pt easily directed back into the bed by this nurse. Pt steady and exhibits good control of movement. Will place a fall risk band on pt due to previous fall .

## 2017-02-06 NOTE — ED Provider Notes (Signed)
[facility]-EMERGENCY DEPT Provider Note   CSN: [id number] Arrival date & time: 02/06/17  1520     History   Chief Complaint Chief Complaint  Patient presents with  . Altered Mental Status    A&Ox4    HPI Blake Little is a 71 y.o. male who presents with EMS for AMS. There is a level 5 caveat due to altered mental status.  I gathered the history from the patient's daughter Isabella Stalling who is listed as a contact allowed access to his PHI. 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 She states that a doctor  In [place] where he was previously living up until a couple weeks ago was giving him Xanax and Ambien tablets. She states that she felt like she wasn't treating him appropriately with these dependency forming medications. . His doctor abruptly stopped his Xanax and Ambien 2 weeks ago. His daughter states that he has been agitated, he has had increasing confusion and he has been "very edgy." She says that her brother went out of town. He was staying at his home. 2 days ago. She saw him and he had a large black eye. She states he did not know how he got it.  The patient states that he fell but he can give me no further information. He also thinks that he is on a blood thinner that when asked which blood thinner he takes. He states "bh+." He is unable to provide more history. HPI  Past Medical History:  Diagnosis Date  . Anxiety    pt. reported having panic attacks last year  . Depression   . GERD (gastroesophageal reflux disease)   . History of bleeding ulcers 1986   hospitalized  . PTSD (post-traumatic stress disorder)     Patient Active Problem List   Diagnosis Date Noted  . Fall 02/06/2017  . Hyponatremia 02/06/2017  . Acute metabolic encephalopathy 02/06/2017  . Memory problem 01/04/2015  . Weight loss 01/04/2015  . 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 Emphysema lung (HCC) 12/15/2014  . 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 History of alcohol abuse 12/05/2014  . Preventative health care 12/05/2014  . Anxiety and depression 10/29/2014  . Hypertension 10/29/2014  . GERD (gastroesophageal reflux disease) 10/29/2014  . Tobacco abuse 10/29/2014  . History of elevated PSA 10/29/2014  . Chronic headaches 10/29/2014  . Cataracts, bilateral 10/29/2014    Past Surgical History:  Procedure Laterality Date  . STOMACH SURGERY  1986   due to ulcers, pt unsure of procedure       Home Medications    Prior to Admission medications   Medication Sig Start Date End Date Taking? Authorizing Provider  ALPRAZolam Prudy Feeler) 1 MG tablet Take 1 mg by mouth at bedtime as needed for anxiety.   Yes [provider]  zolpidem (AMBIEN) 10 MG tablet Take 10 mg by mouth at bedtime as needed for sleep.   Yes [provider]  buPROPion (WELLBUTRIN SR) 150 MG 12 hr tablet Initial: 150 mg once daily for 3 days; increase to 150 mg twice daily; Patient not taking: Reported on 02/06/2017 01/20/15   [name], NP  FLUoxetine (PROZAC) 40 MG capsule Take 1 capsule (40 mg total) by mouth daily. Patient not taking: Reported on 02/06/2017 10/28/14   [name], NP  lisinopril (PRINIVIL,ZESTRIL) 10 MG tablet Take 1 tablet (10 mg total) by mouth daily. 10/28/14   [name],  NP  meloxicam (MOBIC) 7.5 MG tablet Take 1 tablet (7.5 mg total) by mouth daily as needed for pain. 10/28/14   [name]'Sullivan, Melissa, NP  venlafaxine XR (EFFEXOR-XR) 150 MG 24 hr capsule TAKE ONE CAPSULE BY MOUTH ONCE DAILY WITH  BREAKFAST Patient not taking: Reported on 02/06/2017 04/20/15   [name]'Sullivan, Melissa, NP    Family History Family History  Problem Relation Age of Onset  . COPD Mother   . Heart attack Father   . Arthritis Brother     Social History Social History  Substance Use Topics  . Smoking status: Current Every Day Smoker    Packs/day: 0.75    Years: 53.00    Types: Cigarettes  . Smokeless  tobacco: Not on file  . Alcohol use No     Allergies   Asa [aspirin]   Review of Systems Review of Systems  Ten systems reviewed and are negative for acute change, except as noted in the HPI.   Physical Exam Updated Vital Signs BP (!) 142/55   Pulse (!) 59   Temp 97.6 F (36.4 C) (Oral)   Resp 19   Ht 5\' 8"  (1.727 m)   Wt 51.7 kg (114 lb)   SpO2 99%   BMI 17.33 kg/m   Physical Exam  Constitutional: He is oriented to person, place, and time. He appears well-developed and well-nourished. No distress.  HENT:  Head: Normocephalic.  Large, violaceous bruise around the right eye orbit, right temple with abrasion, no step-off or crepitus noted. Patient is EOMI, but unable to follow commands.  Eyes: Conjunctivae are normal. Pupils are equal, round, and reactive to light. No scleral icterus.  Neck: Normal range of motion. Neck supple.  Cardiovascular: Normal rate, regular rhythm and normal heart sounds.   Pulmonary/Chest: Effort normal and breath sounds normal. No respiratory distress.  Abdominal: Soft. There is no tenderness.  Musculoskeletal: He exhibits no edema.  Neurological: He is alert and oriented to person, place, and time. GCS eye subscore is 4. GCS verbal subscore is 4. GCS motor subscore is 5.  Skin: Skin is warm and dry. He is not diaphoretic.  Psychiatric: His behavior is normal.  Nursing note and vitals reviewed.    ED Treatments / Results  Labs (all labs ordered are listed, but only abnormal results are displayed) Labs Reviewed  CBC WITH DIFFERENTIAL/PLATELET - Abnormal; Notable for the following:       Result Value   RBC 4.00 (*)    Hemoglobin 11.9 (*)    HCT 35.7 (*)    All other components within normal limits  COMPREHENSIVE METABOLIC PANEL - Abnormal; Notable for the following:    Sodium 129 (*)    Chloride 97 (*)    All other components within normal limits  URINALYSIS, ROUTINE W REFLEX MICROSCOPIC - Abnormal; Notable for the following:     Ketones, ur 20 (*)    All other components within normal limits  RAPID URINE DRUG SCREEN, HOSP PERFORMED - Abnormal; Notable for the following:    Benzodiazepines POSITIVE (*)    Tetrahydrocannabinol POSITIVE (*)    All other components within normal limits  CK - Abnormal; Notable for the following:    Total CK 427 (*)    All other components within normal limits  ETHANOL  AMMONIA  OSMOLALITY, URINE  OSMOLALITY  SODIUM, URINE, RANDOM  TSH  BASIC METABOLIC PANEL  CBC  CBG MONITORING, ED    EKG  EKG Interpretation None  Radiology Ct Head Wo Contrast  Result Date: 02/06/2017 CLINICAL DATA:  71 year old male status post fall yesterday with subsequent confusion. Right supraorbital hematoma. On blood thinners. EXAM: CT HEAD WITHOUT CONTRAST TECHNIQUE: Contiguous axial images were obtained from the base of the skull through the vertex without intravenous contrast. COMPARISON:  None. FINDINGS: Brain: No midline shift, ventriculomegaly, mass effect, evidence of mass lesion, intracranial hemorrhage or evidence of cortically based acute infarction. No cortical encephalomalacia. Normal for age gray-white matter differentiation. Vascular: Calcified atherosclerosis at the skull base. No suspicious intracranial vascular hyperdensity. Skull: No fracture identified. No acute osseous abnormality identified. Sinuses/Orbits: Visualized paranasal sinuses and mastoids are well pneumatized. Other: No discrete scalp hematoma identified. Visible orbit and face soft tissues appear within normal limits. IMPRESSION: No acute intracranial abnormality or acute traumatic injury identified. Normal for age noncontrast CT appearance of the brain. Electronically Signed   By: Odessa Fleming M.D.   On: 02/06/2017 17:19    Procedures Procedures (including critical care time)  Medications Ordered in ED Medications  zolpidem (AMBIEN) tablet 5 mg (not administered)  LORazepam (ATIVAN) injection 0.5 mg (not administered)   hydrALAZINE (APRESOLINE) injection 5 mg (not administered)  0.9 %  sodium chloride infusion (not administered)  nicotine (NICODERM CQ - dosed in mg/24 hours) patch 21 mg (not administered)  enoxaparin (LOVENOX) injection 40 mg (not administered)  sodium chloride flush (NS) 0.9 % injection 3 mL (not administered)  acetaminophen (TYLENOL) tablet 650 mg (not administered)    Or  acetaminophen (TYLENOL) suppository 650 mg (not administered)  ondansetron (ZOFRAN) tablet 4 mg (not administered)    Or  ondansetron (ZOFRAN) injection 4 mg (not administered)  pantoprazole (PROTONIX) EC tablet 40 mg (not administered)     Initial Impression / Assessment and Plan / ED Course  I have reviewed the triage vital signs and the nursing notes.  Pertinent labs & imaging results that were available during my care of the patient were reviewed by me and considered in my medical decision making (see chart for details).  Clinical Course as of Feb 06 2014  Thu Feb 06, 2017  1816 Sodium: (!) 129 [AH]  1817 Hemoglobin: (!) 11.9 [AH]  1817 CT Head Wo Contrast [AH]    Clinical Course User Index [AH] Arthor Captain, PA-C    patient with Encephalopathy- this may be secondary to benzo withdrawal. No evidence of infection. His CT head is negative. Will admit   Final Clinical Impressions(s) / ED Diagnoses   Final diagnoses:  Encephalopathy  Difficulty speaking    New Prescriptions New Prescriptions   No medications on file     Arthor Captain, PA-C 02/06/17 2015    Lavera Guise, MD 02/07/17 [phone]

## 2017-02-06 NOTE — ED Notes (Signed)
Family at the bedside.

## 2017-02-06 NOTE — ED Notes (Signed)
Dr Lindzen at the bedside 

## 2017-02-06 NOTE — ED Notes (Signed)
This nurse received call from MRI stating that pt "is coding". On arrival to MRI, pt placed on Zoll pads, pt found to have 2+ radial pulses, foam with blood noted to pt's mouth, pt stiff and tachypneic, O2 sats decreased to 87% on RA. Pt placed on 6L . Pt transported from MRI back to initial treatment room in the ED. Pt with bladder incontinence and small lac noted to tongue secondary to biting tongue. Dr. Clyde LundborgNiu, MandersonAbigal, PA, and Dr. Verdie MosherLiu at the bedside. Pt given 1 mg IV ativan. Pt postictal, oriented to self, able to respond to voice. Family at the bedside, updated on pt condition.

## 2017-02-06 NOTE — ED Notes (Signed)
Attempted report x1. 

## 2017-02-06 NOTE — ED Notes (Signed)
Family at bedside. 

## 2017-02-06 NOTE — ED Notes (Addendum)
Pt and family updated on pt plan of care. Per LabetteAbigail, [place]PA, pt able to eat. Pt alert with good head control. Pt given Mal[place]key sandwich.

## 2017-02-06 NOTE — ED Notes (Signed)
Upon Dr. Clyde LundborgNiu leaving pt room, pt daughter reports pt had another episode where he was not responding appropriately to questions only answering "yes" or not responding. This nurse continued to speak to pt in an attempt to change to subject pt might begin responding to. After multiple attempts pt began to respond appropriately. When asked if pt is aware of what is happening during these episodes pt states "Yes, it feels like I guess a 'delay' in what I'm thinking and trying to get out of my mouth." Admitting paged to update on pt's episode. Pt alert and oriented at this time, speaking in complete sentences, and answering questions appropriately.

## 2017-02-06 NOTE — ED Notes (Signed)
Pt daughter leaving to pick up clothes for pt. Phone number left to update pt's daughter on pt room assignment.   Hallie: [phone]

## 2017-02-06 NOTE — ED Notes (Signed)
Attempted report x 2 

## 2017-02-06 NOTE — H&P (Addendum)
History and Physical    Blake Little ZOX:[id number] DOB: 1946-02-07 DOA: 02/06/2017  Referring MD/NP/PA:   PCP: [name], NP   Patient coming from:  The patient is coming from home.  At baseline, pt is independent for most of ADL.\   Chief Complaint: AMS, fall and difficult speaking.  HPI: Blake Little is a 71 y.o. male with medical history significant of hypertension, GERD, depression, anxiety, peptic ulcer disease with bleeding, PTSD, tobacco abuse, alcohol abuse in remission since 2009, who presents with altered mental status, difficult speaking and fall.  Per his daugther, pt was on Xanaxa and Ambien, his doctor abruptly stopped his Xanax and Ambien 2 weeks ago, but pt still takes approximately half dose of Xanax currently. Daughter states that the patient became confused in the past 2 days and more agitated. Pt had fall and had right eye bruise, but his daughter is not sure when it happened. Pt thinks that he is on a blood thinner that when asked which blood thinner he takes, he states "bh+." He is unable to provide more history. Daughter states that the patient had one episode of difficult speaking which lasted for about 2 seconds. Patient does not have unilateral weakness, numbness or tingliness in extremities, no facial droop, slurred speech, vision change or hearing loss. Patient does not have chest pain, SOB, cough, fever or chills. No nausea, vomiting, diarrhea, abdominal pain, symptoms of UTI.   ED Course: pt was found to have WBC 7.0, sodium 129, creatinine normal, CK 427, alcohol less than 5, UDS positive for THC and Benzo, temperature normal, tachycardia, O2 sats 98% on room air, negative CT head for acute intracranial abnormalities. Pt is placed on tele bed for obs. Neurology, Dr. Otelia Limes was consulted.   Review of Systems:   General: no fevers, chills, no changes in body weight, has fatigue HEENT: no blurry vision, hearing changes or sore throat Respiratory: no  dyspnea, coughing, wheezing CV: no chest pain, no palpitations GI: no nausea, vomiting, abdominal pain, diarrhea, constipation GU: no dysuria, burning on urination, increased urinary frequency, hematuria  Ext: no leg edema Neuro: no unilateral weakness, numbness, or tingling, no vision change or hearing loss. Has confusion and fall. Skin: has bruises in right eye MSK: No muscle spasm, no deformity, no limitation of range of movement in spin Heme: No easy bruising.  Travel history: No recent long distant travel.  Allergy:  Allergies  Allergen Reactions  . Asa [Aspirin]     Abdominal pain    Past Medical History:  Diagnosis Date  . Anxiety    pt. reported having panic attacks last year  . Depression   . GERD (gastroesophageal reflux disease)   . History of bleeding ulcers 1986   hospitalized  . PTSD (post-traumatic stress disorder)     Past Surgical History:  Procedure Laterality Date  . STOMACH SURGERY  1986   due to ulcers, pt unsure of procedure    Social History:  reports that he has been smoking Cigarettes.  He has a 39.75 pack-year smoking history. He does not have any smokeless tobacco history on file. He reports that he uses drugs. He reports that he does not drink alcohol.  Family History:  Family History  Problem Relation Age of Onset  . COPD Mother   . Heart attack Father   . Arthritis Brother      Prior to Admission medications   Medication Sig Start Date End Date Taking? Authorizing Provider  ALPRAZolam Prudy Feeler) 1  MG tablet Take 1 mg by mouth at bedtime as needed for anxiety.   Yes [provider]  zolpidem (AMBIEN) 10 MG tablet Take 10 mg by mouth at bedtime as needed for sleep.   Yes [provider]  buPROPion (WELLBUTRIN SR) 150 MG 12 hr tablet Initial: 150 mg once daily for 3 days; increase to 150 mg twice daily; Patient not taking: Reported on 02/06/2017 01/20/15   [name], NP  FLUoxetine (PROZAC) 40 MG capsule Take 1  capsule (40 mg total) by mouth daily. Patient not taking: Reported on 02/06/2017 10/28/14   [name], NP  lisinopril (PRINIVIL,ZESTRIL) 10 MG tablet Take 1 tablet (10 mg total) by mouth daily. 10/28/14   [name], NP  meloxicam (MOBIC) 7.5 MG tablet Take 1 tablet (7.5 mg total) by mouth daily as needed for pain. 10/28/14   [name], NP  venlafaxine XR (EFFEXOR-XR) 150 MG 24 hr capsule TAKE ONE CAPSULE BY MOUTH ONCE DAILY WITH  BREAKFAST Patient not taking: Reported on 02/06/2017 04/20/15   [name], NP    Physical Exam: Vitals:   02/06/17 1645 02/06/17 1915 02/06/17 1930 02/06/17 2000  BP: (!) 149/62 (!) 148/69 (!) 168/62 (!) 142/55  Pulse: 66 71 62 (!) 59  Resp: 16 15 (!) 21 19  Temp:      TempSrc:      SpO2: 100% 100% 100% 99%  Weight:      Height:       General: Not in acute distress HEENT:       Eyes: PERRL, EOMI, no scleral icterus.       ENT: No discharge from the ears and nose, no pharynx injection, no tonsillar enlargement.        Neck: No JVD, no bruit, no mass felt. Heme: No neck lymph node enlargement. Cardiac: S1/S2, RRR, No murmurs, No gallops or rubs. Respiratory: No rales, wheezing, rhonchi or rubs. GI: Soft, nondistended, nontender, no rebound pain, no organomegaly, BS present. GU: No hematuria Ext: No pitting leg edema bilaterally. 2+DP/PT pulse bilaterally. Musculoskeletal: No joint deformities, No joint redness or warmth, no limitation of ROM in spin. Skin: No rashes.  has bruises in right eye Neuro: mildly confused, but oriented X3, cranial nerves II-XII grossly intact, moves all extremities normally. Muscle strength 5/5 in all extremities, sensation to light touch intact. Brachial reflex 2+ bilaterally. Negative Babinski's sign. Psych: Patient is not psychotic, no suicidal or hemocidal ideation.  Labs on Admission: I have personally reviewed following labs and imaging studies  CBC:  Recent Labs Lab 02/06/17 1647  WBC  7.0  NEUTROABS 5.3  HGB 11.9*  HCT 35.7*  MCV 89.3  PLT 323   Basic Metabolic Panel:  Recent Labs Lab 02/06/17 1647  NA 129*  K 4.2  CL 97*  CO2 24  GLUCOSE 91  BUN 11  CREATININE 1.04  CALCIUM 9.3   GFR: Estimated Creatinine Clearance: 49 mL/min (by C-G formula based on SCr of 1.04 mg/dL). Liver Function Tests:  Recent Labs Lab 02/06/17 1647  AST 31  ALT 17  ALKPHOS 75  BILITOT 0.5  PROT 6.6  ALBUMIN 3.5   No results for input(s): LIPASE, AMYLASE in the last 168 hours.  Recent Labs Lab 02/06/17 1647  AMMONIA 16   Coagulation Profile: No results for input(s): INR, PROTIME in the last 168 hours. Cardiac Enzymes:  Recent Labs Lab 02/06/17 1647  CKTOTAL 427*   BNP (last 3 results) No results for input(s): PROBNP in the last  8760 hours. HbA1C: No results for input(s): HGBA1C in the last 72 hours. CBG:  Recent Labs Lab 02/06/17 1654  GLUCAP 70   Lipid Profile: No results for input(s): CHOL, HDL, LDLCALC, TRIG, CHOLHDL, LDLDIRECT in the last 72 hours. Thyroid Function Tests: No results for input(s): TSH, T4TOTAL, FREET4, T3FREE, THYROIDAB in the last 72 hours. Anemia Panel: No results for input(s): VITAMINB12, FOLATE, FERRITIN, TIBC, IRON, RETICCTPCT in the last 72 hours. Urine analysis:    Component Value Date/Time   COLORURINE YELLOW 02/06/2017 1805   APPEARANCEUR CLEAR 02/06/2017 1805   LABSPEC 1.024 02/06/2017 1805   PHURINE 5.0 02/06/2017 1805   GLUCOSEU NEGATIVE 02/06/2017 1805   HGBUR NEGATIVE 02/06/2017 1805   BILIRUBINUR NEGATIVE 02/06/2017 1805   KETONESUR 20 (A) 02/06/2017 1805   PROTEINUR NEGATIVE 02/06/2017 1805   NITRITE NEGATIVE 02/06/2017 1805   LEUKOCYTESUR NEGATIVE 02/06/2017 1805   Sepsis Labs: @LABRCNTIP (procalcitonin:4,lacticidven:4) )No results found for this or any previous visit (from the past 240 hour(s)).   Radiological Exams on Admission: Ct Head Wo Contrast  Result Date: 02/06/2017 CLINICAL DATA:   71 year old male status post fall yesterday with subsequent confusion. Right supraorbital hematoma. On blood thinners. EXAM: CT HEAD WITHOUT CONTRAST TECHNIQUE: Contiguous axial images were obtained from the base of the skull through the vertex without intravenous contrast. COMPARISON:  None. FINDINGS: Brain: No midline shift, ventriculomegaly, mass effect, evidence of mass lesion, intracranial hemorrhage or evidence of cortically based acute infarction. No cortical encephalomalacia. Normal for age gray-white matter differentiation. Vascular: Calcified atherosclerosis at the skull base. No suspicious intracranial vascular hyperdensity. Skull: No fracture identified. No acute osseous abnormality identified. Sinuses/Orbits: Visualized paranasal sinuses and mastoids are well pneumatized. Other: No discrete scalp hematoma identified. Visible orbit and face soft tissues appear within normal limits. IMPRESSION: No acute intracranial abnormality or acute traumatic injury identified. Normal for age noncontrast CT appearance of the brain. Electronically Signed   By: Odessa Fleming M.D.   On: 02/06/2017 17:19     EKG: Not done in ED, will get one.   Assessment/Plan Principal Problem:   Acute metabolic encephalopathy Active Problems:   Anxiety and depression   Hypertension   GERD (gastroesophageal reflux disease)   Tobacco abuse   History of alcohol abuse   Fall   Hyponatremia   Acute metabolic encephalopathy: Etiology is not clear. Patient is mildly confused, but oriented 3. Daughter states that the patient's Xanax and Ambien were abruptly discontinued by his doctor, but pt is still taking approximately half dose of Xanax, and his UDS is positive for benzo, less likely to have acute distress. Other differential diagnosis includes syncope, TIA given short episode of difficult speaking, seizure, substance use (marijuana positive). Hyponatremia may have also contributed partially. Neurology, Dr. Otelia Limes was  consulted  - will place on tele bed for obs - troponins x3  - EEG - seizure precaution - scheduled Ativan for possible withdraw seizure - Neuro checks  - check A1c, FLP - PT/OT eval and treat - f/u neurology's recommendation  Addendum:  Neuro, Dr. Otelia Limes saw pt, made following recommendations:  1. Seizure precautions 2. Scheduled benzodiazepine such as Valium or Ativan x 3 days, then taper.  3. CIWA scoring by nursing at periodic intervals.  4. Gradual correction of hyponatremia 5. Thiamine supplementation 6. Vitamin B12 level.  7. If no improvement to reflexes after seizure-free for 12 hours, obtain MRI thoracic spine to assess for possible myelopathy.    Fall: CT-head negative. -pt/ot  Hyponatremia: Likely multifactorial etiology, including  poor oral intake, dehydration and Prinzide use, or thyroid dysfunction  - Will check urine sodium, urine osmolality, serum osmolality. - check TSH - Fluid restriction when able to eat - IVF: normal saline at 100 mL/h-->125 cc/h - check BMP q6h  Anxiety and depression: Patient is not taking Prozac and Wellbutrin. No SI or HI ideation -prn ativan for anxiey  Hypertension: -hold lisinopril due to hyponatremia -Start amlodipine 5 mg daily -IV hydralazine when necessary  GERD: -Protonix  Tobacco abuse and Alcohol abuse: pt stopped drinking alcohol 2009 -Did counseling about importance of quitting smoking -Nicotine patch   DVT ppx: SQ Lovenox Code Status: Full code Family Communication:  Yes, patient's daughter at bed side Disposition Plan:  Anticipate discharge back to previous home environment Consults called:  none Admission status: Obs / tele   Date of Service 02/06/2017    Lorretta HarpIU, Rilley Stash [facility] Pager [phone]  If 7PM-7AM, please contact night-coverage [email or web address] Password [email or web address] 02/06/2017, 8:22 PM

## 2017-02-07 ENCOUNTER — Observation Stay (HOSPITAL_COMMUNITY): Payer: Self-pay

## 2017-02-07 DIAGNOSIS — G40901 Epilepsy, unspecified, not intractable, with status epilepticus: Secondary | ICD-10-CM | POA: Diagnosis not present

## 2017-02-07 LAB — BASIC METABOLIC PANEL
ANION GAP: 9 (ref 5–15)
Anion gap: 7 (ref 5–15)
Anion gap: 10 (ref 5–15)
Anion gap: 13 (ref 5–15)
BUN: 12 mg/dL (ref 6–20)
BUN: 8 mg/dL (ref 6–20)
BUN: 8 mg/dL (ref 6–20)
BUN: 8 mg/dL (ref 6–20)
CALCIUM: 9.1 mg/dL (ref 8.9–10.3)
CHLORIDE: 97 mmol/L — AB (ref 101–111)
CHLORIDE: 97 mmol/L — AB (ref 101–111)
CHLORIDE: 99 mmol/L — AB (ref 101–111)
CO2: 19 mmol/L — AB (ref 22–32)
CO2: 20 mmol/L — AB (ref 22–32)
CO2: 22 mmol/L (ref 22–32)
CO2: 25 mmol/L (ref 22–32)
CREATININE: 0.8 mg/dL (ref 0.61–1.24)
CREATININE: 0.81 mg/dL (ref 0.61–1.24)
CREATININE: 1.04 mg/dL (ref 0.61–1.24)
Calcium: 9.5 mg/dL (ref 8.9–10.3)
Calcium: 9.6 mg/dL (ref 8.9–10.3)
Calcium: 9.4 mg/dL (ref 8.9–10.3)
Chloride: 100 mmol/L — ABNORMAL LOW (ref 101–111)
Creatinine, Ser: 0.91 mg/dL (ref 0.61–1.24)
GFR calc Af Amer: >60 mL/min (ref >60)
GFR calc Af Amer: >60 mL/min (ref >60)
GFR calc Af Amer: >60 mL/min (ref >60)
GFR calc Af Amer: >60 mL/min (ref >60)
GFR calc non Af Amer: >60 mL/min (ref >60)
GFR calc non Af Amer: >60 mL/min (ref >60)
GFR calc non Af Amer: >60 mL/min (ref >60)
GLUCOSE: 105 mg/dL — AB (ref 65–99)
GLUCOSE: 98 mg/dL (ref 65–99)
Glucose, Bld: 81 mg/dL (ref 65–99)
Glucose, Bld: 88 mg/dL (ref 65–99)
POTASSIUM: 4 mmol/L (ref 3.5–5.1)
POTASSIUM: 4.8 mmol/L (ref 3.5–5.1)
Potassium: 4.4 mmol/L (ref 3.5–5.1)
Potassium: 4.2 mmol/L (ref 3.5–5.1)
SODIUM: 131 mmol/L — AB (ref 135–145)
Sodium: 129 mmol/L — ABNORMAL LOW (ref 135–145)
Sodium: 129 mmol/L — ABNORMAL LOW (ref 135–145)
Sodium: 129 mmol/L — ABNORMAL LOW (ref 135–145)

## 2017-02-07 LAB — TROPONIN I
Troponin I: <0.03 ng/mL (ref <0.03)
Troponin I: <0.03 ng/mL (ref <0.03)
Troponin I: <0.03 ng/mL (ref <0.03)

## 2017-02-07 LAB — CBC
HEMATOCRIT: 36.5 % — AB (ref 39.0–52.0)
Hemoglobin: 12.2 g/dL — ABNORMAL LOW (ref 13.0–17.0)
MCH: 30 pg (ref 26.0–34.0)
MCHC: 33.4 g/dL (ref 30.0–36.0)
MCV: 89.7 fL (ref 78.0–100.0)
PLATELETS: 349 10*3/uL (ref 150–400)
RBC: 4.07 MIL/uL — ABNORMAL LOW (ref 4.22–5.81)
RDW: 14.8 % (ref 11.5–15.5)
WBC: 9.4 10*3/uL (ref 4.0–10.5)

## 2017-02-07 LAB — MRSA PCR SCREENING: MRSA by PCR: INVALID — AB

## 2017-02-07 LAB — VITAMIN B12: VITAMIN B 12: 581 pg/mL (ref 180–914)

## 2017-02-07 LAB — LIPID PANEL
CHOL/HDL RATIO: 3.9 ratio
Cholesterol: 239 mg/dL — ABNORMAL HIGH (ref 0–200)
HDL: 61 mg/dL (ref >40)
LDL CALC: 150 mg/dL — AB (ref 0–99)
TRIGLYCERIDES: 142 mg/dL (ref <150)
VLDL: 28 mg/dL (ref 0–40)

## 2017-02-07 LAB — TSH: TSH: 0.222 u[IU]/mL — AB (ref 0.350–4.500)

## 2017-02-07 LAB — OSMOLALITY: OSMOLALITY: 275 mosm/kg (ref 275–295)

## 2017-02-07 MED ORDER — VITAMIN B-1 100 MG PO TABS: 100.0000 mg | ORAL_TABLET | Freq: Every day | ORAL | Status: DC

## 2017-02-07 MED ORDER — LORAZEPAM 2 MG/ML IJ SOLN
0.5000 mg | Freq: Three times a day (TID) | INTRAMUSCULAR | Status: DC
  Administered 2017-02-07: 0.5 mg via INTRAVENOUS
  Filled 2017-02-07: qty 1

## 2017-02-07 MED ORDER — ORAL CARE MOUTH RINSE
15.0000 mL | Freq: Two times a day (BID) | OROMUCOSAL | Status: DC
  Administered 2017-02-07 – 2017-02-11 (×8): 15 mL via OROMUCOSAL

## 2017-02-07 MED ORDER — LORAZEPAM 2 MG/ML IJ SOLN: 1.0000 mg | Freq: Three times a day (TID) | INTRAMUSCULAR | Status: DC

## 2017-02-07 MED ORDER — FOLIC ACID 5 MG/ML IJ SOLN
1.0000 mg | Freq: Every day | INTRAMUSCULAR | Status: DC
  Administered 2017-02-07 – 2017-02-09 (×3): 1 mg via INTRAVENOUS
  Filled 2017-02-07 (×3): qty 0.2

## 2017-02-07 MED ORDER — PHENYTOIN SODIUM 50 MG/ML IJ SOLN
100.0000 mg | Freq: Three times a day (TID) | INTRAMUSCULAR | Status: DC
  Administered 2017-02-07 – 2017-02-09 (×5): 100 mg via INTRAVENOUS
  Filled 2017-02-07 (×6): qty 2

## 2017-02-07 MED ORDER — PHENYTOIN SODIUM 50 MG/ML IJ SOLN
1000.0000 mg | Freq: Once | INTRAMUSCULAR | Status: AC
  Administered 2017-02-07: 1000 mg via INTRAVENOUS
  Filled 2017-02-07: qty 20

## 2017-02-07 MED ORDER — LORAZEPAM 2 MG/ML IJ SOLN
2.0000 mg | INTRAMUSCULAR | Status: DC | PRN
Start: 2017-02-07 — End: 2017-02-10
  Administered 2017-02-07: 3 mg via INTRAVENOUS
  Administered 2017-02-07 – 2017-02-08 (×2): 2 mg via INTRAVENOUS
  Filled 2017-02-07: qty 2
  Filled 2017-02-07 (×3): qty 1

## 2017-02-07 MED ORDER — PNEUMOCOCCAL VAC POLYVALENT 25 MCG/0.5ML IJ INJ
0.5000 mL | INJECTION | INTRAMUSCULAR | Status: AC
  Administered 2017-02-10: 0.5 mL via INTRAMUSCULAR

## 2017-02-07 MED ORDER — THIAMINE HCL 100 MG/ML IJ SOLN
100.0000 mg | Freq: Every day | INTRAMUSCULAR | Status: DC
  Administered 2017-02-07 – 2017-02-09 (×3): 100 mg via INTRAVENOUS
  Filled 2017-02-07 (×3): qty 2

## 2017-02-07 MED FILL — Medication: Qty: 1 | Status: AC

## 2017-02-07 NOTE — Progress Notes (Signed)
Physician notified: Cherylynn RidgesShikhman At: 1526  Regarding: ?CIWA falsely positive r/t seizures? more agitated now. have order for PRN Ativan w CIWA scale. ok to use?   Awaiting return response.

## 2017-02-07 NOTE — Progress Notes (Signed)
PROGRESS NOTE    Blake Little  WGN:[id number] DOB: July 28, 1946 DOA: 02/06/2017 PCP: [name]'Sullivan, Melissa, NP   Brief Narrative:  71 y.o. WM PMHx Anxiety, HTN, Depression,PTSD, PUD with bleeding, Tobacco abuse, EtOH abuse in remission since 2009, who presents with altered mental status, difficult speaking and fall.  Per his daugther, pt was on Xanaxa and Ambien, his doctor abruptly stopped his Xanax and Ambien 2 weeks ago, but pt still takes approximately half dose of Xanax currently. Daughter states that the patient became confused in the past 2 days and more agitated. Pt had fall and had right eye bruise, but his daughter is not sure when it happened. Pt thinks that he is on a blood thinner that when asked which blood thinner he takes, he states "bh+." He is unable to provide more history. Daughter states that the patient had one episode of difficult speaking which lasted for about 2 seconds. Patient does not have unilateral weakness, numbness or tingliness in extremities, no facial droop, slurred speech, vision change or hearing loss. Patient does not have chest pain, SOB, cough, fever or chills. No nausea, vomiting, diarrhea, abdominal pain, symptoms of UTI.   ED Course: pt was found to have WBC 7.0, sodium 129, creatinine normal, CK 427, alcohol less than 5, UDS positive for THC and Benzo, temperature normal, tachycardia, O2 sats 98% on room air, negative CT head for acute intracranial abnormalities. Pt is placed on tele bed for obs. Neurology, Dr. Otelia LimesLindzen was consulted.    Subjective: 6/29 A/O 3 (patient does not know why). Patient just admitted. Per RN waxes and wanes. Negative CP, negative SOB, negative SOB. Being evaluated by neurology for status epilepticus   Assessment & Plan:   Principal Problem:   Acute metabolic encephalopathy Active Problems:   Anxiety and depression   Hypertension   GERD (gastroesophageal reflux disease)   Tobacco abuse   History of alcohol abuse  Fall   Hyponatremia   Acute metabolic encephalopathy: Etiology is not clear. Patient is mildly confused, but oriented 3. Daughter states that the patient's Xanax and Ambien were abruptly discontinued by his doctor, but pt is still taking approximately half dose of Xanax, and his UDS is positive for benzo, less likely to have acute distress. Other differential diagnosis includes syncope, TIA given short episode of difficult speaking, seizure, substance use (marijuana positive). Hyponatremia may have also contributed partially. Neurology, Dr. Otelia LimesLindzen was consulted  - will place on tele bed for obs - troponins x3  - EEG - seizure precaution - scheduled Ativan for possible withdraw seizure - Neuro checks  - check A1c, FLP - PT/OT eval and treat - f/u neurology's recommendation  Addendum:  Neuro, Dr. Otelia LimesLindzen saw pt, made following recommendations:  1. Seizure precautions 2. Scheduled benzodiazepine such as Valium or Ativan x 3 days, then taper.  3. CIWA scoring by nursing at periodic intervals.  4. Gradual correction of hyponatremia 5. Thiamine supplementation 6. Vitamin B12 level.  7. If no improvement to reflexes after seizure-free for 12 hours, obtain MRI thoracic spine to assess for possible myelopathy.    Fall: CT-head negative. -pt/ot  Hyponatremia: Likely multifactorial etiology, including poor oral intake, dehydration and Prinzide use, or thyroid dysfunction  - Will check urine sodium, urine osmolality, serum osmolality. - check TSH - Fluid restriction when able to eat - IVF: normal saline at 100 mL/h-->125 cc/h - check BMP q6h  Anxiety and depression: Patient is not taking Prozac and Wellbutrin. No SI or HI ideation -prn ativan  for anxiey  Hypertension: -hold lisinopril due to hyponatremia -Start amlodipine 5 mg daily -IV hydralazine when necessary  GERD: -Protonix  Tobacco abuse  -Did counseling about importance of quitting smoking -Nicotine  patch  EtOH abuse -pt stopped drinking alcohol 2009 -CIWA protocol NOTE: Contact neurology prior to administering benzodiazepines as they are running a 24-hour EEG, and benzodiazepines will interfere with results..      DVT prophylaxis: Lovenox Code Status: Full Family Communication: None Disposition Plan: Per neurology   Consultants:  Neurology    Procedures/Significant Events:  6/29 EEG pending   VENTILATOR SETTINGS:    Cultures   Antimicrobials: Anti-infectives    None       Devices    LINES / TUBES:      Continuous Infusions: . sodium chloride 125 mL/hr at 02/07/17 1810     Objective: Vitals:   02/07/17 1144 02/07/17 1200 02/07/17 1400 02/07/17 1542  BP: (!) 128/56 (!) 134/59 (!) 149/65 138/68  Pulse:  67 65   Resp:  20 13   Temp: 97.8 F (36.6 C)   98.7 F (37.1 C)  TempSrc: Oral   Oral  SpO2:    99%  Weight:      Height:        Intake/Output Summary (Last 24 hours) at 02/07/17 1923 Last data filed at 02/07/17 1700  Gross per 24 hour  Intake          2044.58 ml  Output              450 ml  Net          1594.58 ml   Filed Weights   02/06/17 1534 02/06/17 2352  Weight: 114 lb (51.7 kg) 115 lb 11.9 oz (52.5 kg)    Examination:  General: A/O 3, (does not know why) No acute respiratory distress, cachectic Eyes: negative scleral hemorrhage, negative anisocoria, negative icterus ENT: Negative Runny nose, negative gingival bleeding, Neck:  Negative scars, masses, torticollis, lymphadenopathy, JVD Lungs: Clear to auscultation bilaterally without wheezes or crackles Cardiovascular: Regular rate and rhythm without murmur gallop or rub normal S1 and S2 Abdomen: negative abdominal pain, nondistended, positive soft, bowel sounds, no rebound, no ascites, no appreciable mass Extremities: No significant cyanosis, clubbing, or edema bilateral lower extremities Skin: Negative rashes, lesions, ulcers Psychiatric:  Unable to fully assess  secondary to patient's altered mental status   Central nervous system:  Cranial nerves II through XII intact, tongue/uvula midline, all extremities muscle strength 5/5, sensation intact throughout,negative dysarthria, negative expressive aphasia, negative receptive aphasia.  .     Data Reviewed: Care during the described time interval was provided by me .  I have reviewed this patient's available data, including medical history, events of note, physical examination, and all test results as part of my evaluation. I have personally reviewed and interpreted all radiology studies.  CBC:  Recent Labs Lab 02/06/17 1647 02/07/17 0612  WBC 7.0 9.4  NEUTROABS 5.3  --   HGB 11.9* 12.2*  HCT 35.7* 36.5*  MCV 89.3 89.7  PLT 323 349   Basic Metabolic Panel:  Recent Labs Lab 02/06/17 1647 02/06/17 2354 02/07/17 0612 02/07/17 1242 02/07/17 1648  NA 129* 129* 131* 129* 129*  K 4.2 4.8 4.0 4.4 4.2  CL 97* 97* 100* 99* 97*  CO2 24 25 22  20* 19*  GLUCOSE 91 105* 81 98 88  BUN 11 12 8 8 8   CREATININE 1.04 1.04 0.91 0.81 0.80  CALCIUM 9.3 9.5 9.1 9.6  9.4   GFR: Estimated Creatinine Clearance: 64.7 mL/min (by C-G formula based on SCr of 0.8 mg/dL). Liver Function Tests:  Recent Labs Lab 02/06/17 1647  AST 31  ALT 17  ALKPHOS 75  BILITOT 0.5  PROT 6.6  ALBUMIN 3.5   No results for input(s): LIPASE, AMYLASE in the last 168 hours.  Recent Labs Lab 02/06/17 1647  AMMONIA 16   Coagulation Profile: No results for input(s): INR, PROTIME in the last 168 hours. Cardiac Enzymes:  Recent Labs Lab 02/06/17 1647 02/06/17 2354 02/07/17 0612 02/07/17 1242  CKTOTAL 427*  --   --   --   TROPONINI  --  <0.03 <0.03 <0.03   BNP (last 3 results) No results for input(s): PROBNP in the last 8760 hours. HbA1C: No results for input(s): HGBA1C in the last 72 hours. CBG:  Recent Labs Lab 02/06/17 1654 02/06/17 2138  GLUCAP 70 209*   Lipid Profile:  Recent Labs  02/07/17 0612   CHOL 239*  HDL 61  LDLCALC 150*  TRIG 142  CHOLHDL 3.9   Thyroid Function Tests:  Recent Labs  02/06/17 2354  TSH 0.222*   Anemia Panel:  Recent Labs  02/07/17 1242  VITAMINB12 581   Urine analysis:    Component Value Date/Time   COLORURINE YELLOW 02/06/2017 1805   APPEARANCEUR CLEAR 02/06/2017 1805   LABSPEC 1.024 02/06/2017 1805   PHURINE 5.0 02/06/2017 1805   GLUCOSEU NEGATIVE 02/06/2017 1805   HGBUR NEGATIVE 02/06/2017 1805   BILIRUBINUR NEGATIVE 02/06/2017 1805   KETONESUR 20 (A) 02/06/2017 1805   PROTEINUR NEGATIVE 02/06/2017 1805   NITRITE NEGATIVE 02/06/2017 1805   LEUKOCYTESUR NEGATIVE 02/06/2017 1805   Sepsis Labs: @LABRCNTIP (procalcitonin:4,lacticidven:4)  ) Recent Results (from the past 240 hour(s))  MRSA PCR Screening     Status: Abnormal   Collection Time: 02/07/17 12:01 AM  Result Value Ref Range Status   MRSA by PCR INVALID RESULTS, SPECIMEN SENT FOR CULTURE (A) NEGATIVE Final    Comment: CALLED TO J.TERRY,RN AT 0345 BY L.PITT 02/08/17        The GeneXpert MRSA Assay (FDA approved for NASAL specimens only), is one component of a comprehensive MRSA colonization surveillance program. It is not intended to diagnose MRSA infection nor to guide or monitor treatment for MRSA infections.   MRSA culture     Status: None (Preliminary result)   Collection Time: 02/07/17 12:01 AM  Result Value Ref Range Status   Specimen Description NASAL SWAB  Final   Special Requests NONE  Final   Culture TOO YOUNG TO READ  Final   Report Status PENDING  Incomplete         Radiology Studies: Ct Head Wo Contrast  Result Date: 02/06/2017 CLINICAL DATA:  71 year old male status post fall yesterday with subsequent confusion. Right supraorbital hematoma. On blood thinners. EXAM: CT HEAD WITHOUT CONTRAST TECHNIQUE: Contiguous axial images were obtained from the base of the skull through the vertex without intravenous contrast. COMPARISON:  None. FINDINGS:  Brain: No midline shift, ventriculomegaly, mass effect, evidence of mass lesion, intracranial hemorrhage or evidence of cortically based acute infarction. No cortical encephalomalacia. Normal for age gray-white matter differentiation. Vascular: Calcified atherosclerosis at the skull base. No suspicious intracranial vascular hyperdensity. Skull: No fracture identified. No acute osseous abnormality identified. Sinuses/Orbits: Visualized paranasal sinuses and mastoids are well pneumatized. Other: No discrete scalp hematoma identified. Visible orbit and face soft tissues appear within normal limits. IMPRESSION: No acute intracranial abnormality or acute traumatic injury identified. Normal for  age noncontrast CT appearance of the brain. Electronically Signed   By: Odessa Fleming M.D.   On: 02/06/2017 17:19        Scheduled Meds: .  stroke: mapping our early stages of recovery book   Does not apply Once  . enoxaparin (LOVENOX) injection  40 mg Subcutaneous Q24H  . folic acid  1 mg Intravenous Daily  . nicotine  21 mg Transdermal Daily  . phenytoin (DILANTIN) IV  100 mg Intravenous Q8H  . [START ON 02/08/2017] pneumococcal 23 valent vaccine  0.5 mL Intramuscular Tomorrow-1000  . sodium chloride flush  3 mL Intravenous Q12H  . thiamine  100 mg Intravenous Daily   Continuous Infusions: . sodium chloride 125 mL/hr at 02/07/17 1810     LOS: 0 days    Time spent: 40 minutes    Freja Faro, Roselind Messier, MD [facility] Pager [phone]   If 7PM-7AM, please contact night-coverage [email or web address] Password [facility] 02/07/2017, 7:23 PM

## 2017-02-07 NOTE — Progress Notes (Addendum)
Physician notified: Joseph ArtWoods At: 0808  Regarding: Pupils went from 2+, fixed to 1+ fixed. Pt calling out to RN then no response for several minutes while RN in room. Getting out of bed. Oriented to self only. Global aphasia noted. Last meds given at 0032, [place]. Noted changes from neurology note. CT completed, MRI cancelled, awaiting EEG this AM.   Awaiting return response.   Returned Response at: 0808  Order(s): get EEG ASAP.    Physician notified: Joseph ArtWoods At: 1005  Regarding: FYI EEG showing Status per neurology at bedside. Plan to load with Dilantin and do continuous EEG.   Physician notified: Joseph ArtWoods  At: 1117  Regarding:  CIWA is 18, no orders for PRN Ativan r/t CIWA score. High score d/t seizure activity. Got Dilantin load and sched 0.5 Ativan now.

## 2017-02-07 NOTE — Progress Notes (Signed)
PT Cancellation Note  Patient Details Name: Blake Little B Yusupov MRN: [id number] DOB: 09-Dec-1945   Cancelled Treatment:    Reason Eval/Treat Not Completed: Medical issues which prohibited therapy (Neuro status unstable per nurse. HOLD PT today. )   Berline LopesDawn F Herson Prichard 02/07/2017, 9:25 AM  Eber Jonesawn Owen Pagnotta,PT Acute Rehabilitation [phone] (pager)

## 2017-02-07 NOTE — Progress Notes (Signed)
Initial Nutrition Assessment  DOCUMENTATION CODES:   Severe malnutrition in context of chronic illness, Underweight  INTERVENTION:    When diet advanced, will add PO supplements.  NUTRITION DIAGNOSIS:   Malnutrition (severe) related to chronic illness (substance abuse d/o) as evidenced by severe depletion of body fat, severe depletion of muscle mass.  GOAL:   Patient will meet greater than or equal to 90% of their needs  MONITOR:   Diet advancement, PO intake  REASON FOR ASSESSMENT:   Malnutrition Screening Tool    ASSESSMENT:   71 yo male with PMH of anxiety, depression, GERD, PTSD, CHF, DM who was admitted on 6/28 S/P fall with AMS and difficulty speaking. UDS was positive for THC and Benzo.  Per RN, patient is having continuous seizures. Spoke with patient, who was confused, unable to provide any nutrition hx.  Nutrition-Focused physical exam completed. Findings are severe fat depletion, severe muscle depletion, and no edema.  Unsure of usual weight, currently underweight. Labs reviewed: sodium 131 (L) Medications reviewed and include folic acid and thiamine. Currently NPO.  Diet Order:  Diet NPO time specified  Skin:  Reviewed, no issues  Last BM:  6/28  Height:   Ht Readings from Last 1 Encounters:  02/06/17 5\' 8"  (1.727 m)    Weight:   Wt Readings from Last 1 Encounters:  02/06/17 115 lb 11.9 oz (52.5 kg)    Ideal Body Weight:  70 kg  BMI:  Body mass index is 17.6 kg/m.  Estimated Nutritional Needs:   Kcal:  1700-1900  Protein:  80-95 gm  Fluid:  1.8 L  EDUCATION NEEDS:   No education needs identified at this time  Joaquin CourtsKimberly Raheen Capili, RD, LDN, CNSC Pager [phone] After Hours Pager [phone]

## 2017-02-07 NOTE — Progress Notes (Signed)
OT Cancellation Note  Patient Details Name: Blake Little MRN: [id number] DOB: April 12, 1946   Cancelled Treatment:    Reason Eval/Treat Not Completed: Patient not medically ready (changes in neuro status). Will follow up as time allows and pt medically appropriate.   [name] A Jacquiline Zurcher M.S., OTR/L Pager: [phone]  02/07/2017, 9:31 AM

## 2017-02-07 NOTE — Progress Notes (Signed)
Routine EEG converted to LTM

## 2017-02-07 NOTE — Procedures (Signed)
ELECTROENCEPHALOGRAM REPORT  Date of Study: 02/07/2017  Patient's Name: Blake Little MRN: [id number] Date of Birth: [birth year]  Referring Provider: Dr. Lorretta HarpXilin Little  Clinical History: This is a 71 year old man with altered mental status and report of GTC while in MRI.  Medications: acetaminophen (TYLENOL) tablet 650 mg  enoxaparin (LOVENOX) injection 40 mg  hydrALAZINE (APRESOLINE) injection 5 mg  LORazepam (ATIVAN) injection 0.5 mg  nicotine (NICODERM CQ - dosed in mg/24 hours) patch 21 mg  pantoprazole (PROTONIX) EC tablet 40 mg  thiamine (VITAMIN B-1) tablet 100 mg  zolpidem (AMBIEN) tablet 5 mg   Technical Summary: A multichannel digital EEG recording measured by the international 10-20 system with electrodes applied with paste and impedances below 5000 ohms performed in our laboratory with EKG monitoring in an awake and confused patient.  Hyperventilation and photic stimulation were not performed.  The digital EEG was referentially recorded, reformatted, and digitally filtered in a variety of bipolar and referential montages for optimal display.    Description: The patient is awake and confused during the recording.  During maximal wakefulness, there is a symmetric, medium voltage 8 Hz posterior dominant rhythm that attenuates with eye opening.The record is symmetric. At the beginning of the recording, there are several trains of rhythmic 10 Hz activity with intermixed spike discharges seen over the left posterior quadrant region, lasting 10-30 seconds followed by rhythmic 3-4 Hz slowing over the left hemisphere. During a few of these, he is noted to have forced head turn to the right. As the study progresses, the rhythmic trains with intermixed spikes are seen on the right hemisphere as well, then in a generalized fashion lasting 10-40 seconds followed by rhythmic slowing then brief return to baseline EEG before another run of generalized rhythmic discharges then diffuse slowing are  seen until the end of the recording.  EKG lead was unremarkable.  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 Initially seizures arise from the left posterior quadrant, then as the study progresses, epileptiform discharges are seen in a generalized fashion. He is seen to have several episodes of forced head turn to the right and was confused throughout the study. Findings were discussed with Dr. Cherylynn RidgesShikhman on 6/29 at 9:45 AM.   Patrcia DollyKaren Chyla Schlender, M.D.

## 2017-02-07 NOTE — Consult Note (Signed)
NEURO HOSPITALIST CONSULT NOTE   Requestig physician: Dr. Clyde Lundborg  Reason for Consult: Seizures  History obtained from:  Patient and Chart     HPI:                                                                                                                                          Blake Little is an 71 y.o. male who initially presented to the ED for evaluation of AMS. During the evaluation, he had several occurrences seizure-like activity, including a GTC seizure in MRI. The patient states that he has recently discontinued Xanax and Ambien. He states that about 3 years ago he also abruptly quit taking a prescribed benzodiazepine which was followed by 2 black out spells and a severe "anxiety attack".  Per notes, he "Recently moved in to live to daughter 2 weeks ago from [place]. Was abruptly discontinued on his Xanax and Ambient and since then with confusion, agitation and possible fall. Patient Is oriented to self, location, and time, but not oriented to situation. He is unable to provide history. Has no focal neurological deficits. Does have bruising around the right eye likely from recent fall. Afebrile with stable vital signs. CT head shows no acute intracranial injuries. Blood work overall reassuring. UA without infection. Suspect delirium from likely withdrawal from Xanax and Ambien."    Additional documentation per notes: "After patient admitted, patient ordered MRI brain by admitting physician. In radiology, he had a "code event." by report, he had witnessed convulsions, but did not stop breathing and did not lose pulses. I saw patient as he was rolled back to ED. He is somnolent. He was incontinent of urine and had bite on tongue. Gradually becoming more awake and answering questions. Seems consistent with seizure and post-ictal period. Suspect withdrawal seizure from coming off of xanax. Given dose of ativan in the ED."    Past Medical History:  Diagnosis Date  .  Anxiety    pt. reported having panic attacks last year  . Depression   . GERD (gastroesophageal reflux disease)   . History of bleeding ulcers 1986   hospitalized  . PTSD (post-traumatic stress disorder)     Past Surgical History:  Procedure Laterality Date  . STOMACH SURGERY  1986   due to ulcers, pt unsure of procedure    Family History  Problem Relation Age of Onset  . COPD Mother   . Heart attack Father   . Arthritis Brother    Social History:  reports that he has been smoking Cigarettes.  He has a 39.75 pack-year smoking history. He does not have any smokeless tobacco history on file. He reports that he uses drugs. He reports that he does not drink alcohol.  Allergies  Allergen Reactions  . Jonne Ply [  Aspirin]     Abdominal pain    HOME MEDICATIONS:                                                                                                                     ALPRAZolam (XANAX) 1 MG tablet Take 1 mg by mouth at bedtime as needed for anxiety. Lorretta HarpNiu, Xilin, MD Not Ordered  zolpidem (AMBIEN) 10 MG tablet Take 10 mg by mouth at bedtime as needed for sleep. Lorretta HarpNiu, Xilin, MD Reordered  Orderedas:zolpidem [facility](AMBIEN) tablet 5 mg - For male patients and patients age 69 years or older, dosage automatically limited to 5 mg. 5 mg, Oral, At bedtime PRN, sleep, Starting Thu 02/06/17 at 1929  buPROPion [facility](WELLBUTRIN SR) 150 MG 12 hr tablet Initial: 150 mg once daily for 3 days; increase to 150 mg twice daily; Lorretta HarpNiu, Xilin, MD Not Ordered   Patient not taking: Reported on 02/06/2017    FLUoxetine (PROZAC) 40 MG capsule Take 1 capsule (40 mg total) by mouth daily. Lorretta HarpNiu, Xilin, MD Not Ordered   Patient not taking: Reported on 02/06/2017    lisinopril (PRINIVIL,ZESTRIL) 10 MG tablet Take 1 tablet (10 mg total) by mouth daily. Lorretta HarpNiu, Xilin, MD Not Ordered  meloxicam (MOBIC) 7.5 MG tablet Take 1 tablet (7.5 mg total) by mouth daily as needed for pain. Lorretta HarpNiu, Xilin, MD Not Ordered  venlafaxine XR (EFFEXOR-XR)  150 MG 24 hr capsule TAKE ONE CAPSULE BY MOUTH ONCE DAILY WITH Rae LipsBREAKFAST Niu, Xilin, MD Not Ordered    ROS:                                                                                                                                       Has no current complaints.   Blood pressure (!) 124/59, pulse 72, temperature 98.3 F (36.8 C), temperature source Oral, resp. rate 17, height 5\' 8"  (1.727 m), weight 52.5 kg (115 lb 11.9 oz), SpO2 99 %.  General Examination:  HEENT-  Right periorbital bruising and swelling. Small amount of coagulated blood at corners of mouth.     Lungs- Respirations unlabored Extremities- No edema  Neurological Examination Mental Status: Alert, oriented, thought content appropriate.  Speech fluent without evidence of aphasia.  Able to follow all commands without difficulty. Pleasant and cooperative.  Cranial Nerves:  II: Visual fields intact  III,IV, VI: EOMI without nystagmus  V,VII: smile symmetric, facial light touch sensation normal bilaterally VIII: hearing intact to voice IX,X: palate rises symmetrically XI: symmetric shoulder shrug XII: midline tongue extension Motor: Right : Upper extremity   5/5    Left:     Upper extremity   5/5  Lower extremity   5/5     Lower extremity   5/5 Normal tone throughout; no atrophy noted Sensory:  Light touch intact x 4 without extinction Deep Tendon Reflexes: 3+ bilateral brachioradialis and biceps. 4+ patellae and achilles reflexes bilaterally Plantars: Right: downgoing  Left: downgoing Cerebellar: No ataxia with FNF bilaterally Gait: Deferred  Lab Results: Basic Metabolic Panel:  Recent Labs Lab 02/06/17 1647 02/06/17 2354  NA 129* 129*  K 4.2 4.8  CL 97* 97*  CO2 24 25  GLUCOSE 91 105*  BUN 11 12  CREATININE 1.04 1.04  CALCIUM 9.3 9.5    Liver Function Tests:  Recent Labs Lab 02/06/17 1647  AST  31  ALT 17  ALKPHOS 75  BILITOT 0.5  PROT 6.6  ALBUMIN 3.5   No results for input(s): LIPASE, AMYLASE in the last 168 hours.  Recent Labs Lab 02/06/17 1647  AMMONIA 16    CBC:  Recent Labs Lab 02/06/17 1647  WBC 7.0  NEUTROABS 5.3  HGB 11.9*  HCT 35.7*  MCV 89.3  PLT 323    Cardiac Enzymes:  Recent Labs Lab 02/06/17 1647 02/06/17 2354  CKTOTAL 427*  --   TROPONINI  --  <0.03    Lipid Panel: No results for input(s): CHOL, TRIG, HDL, CHOLHDL, VLDL, LDLCALC in the last 168 hours.  CBG:  Recent Labs Lab 02/06/17 1654 02/06/17 2138  GLUCAP 70 209*    Microbiology: No results found for this or any previous visit.  Coagulation Studies: No results for input(s): LABPROT, INR in the last 72 hours.  Imaging: Ct Head Wo Contrast  Result Date: 02/06/2017 CLINICAL DATA:  71 year old male status post fall yesterday with subsequent confusion. Right supraorbital hematoma. On blood thinners. EXAM: CT HEAD WITHOUT CONTRAST TECHNIQUE: Contiguous axial images were obtained from the base of the skull through the vertex without intravenous contrast. COMPARISON:  None. FINDINGS: Brain: No midline shift, ventriculomegaly, mass effect, evidence of mass lesion, intracranial hemorrhage or evidence of cortically based acute infarction. No cortical encephalomalacia. Normal for age gray-white matter differentiation. Vascular: Calcified atherosclerosis at the skull base. No suspicious intracranial vascular hyperdensity. Skull: No fracture identified. No acute osseous abnormality identified. Sinuses/Orbits: Visualized paranasal sinuses and mastoids are well pneumatized. Other: No discrete scalp hematoma identified. Visible orbit and face soft tissues appear within normal limits. IMPRESSION: No acute intracranial abnormality or acute traumatic injury identified. Normal for age noncontrast CT appearance of the brain. Electronically Signed   By: Odessa Fleming M.D.   On: 02/06/2017 17:19    Assessment: 71 year old male with new onset of recurrent seizures 1. Most likely secondary to benzodiazepine withdrawal. Hyponatremia also a possible contributing factor.  2. CT head is normal.  3. Lower extremity pathological reflexes. DDx includes B12 myelopathy and transient hyperreflexia secondary to postictal state.  Recommendations: 1. Seizure precautions 2. Scheduled benzodiazepine such as Valium or Ativan x 3 days, then taper.  3. CIWA scoring by nursing at periodic intervals.  4. Gradual correction of hyponatremia 5. Thiamine supplementation 6. Vitamin B12 level.  7. If no improvement to reflexes after seizure-free for 12 hours, obtain MRI thoracic spine to assess for possible myelopathy.   Electronically signed: Dr. Caryl Pina 02/07/2017, 2:46 AM

## 2017-02-07 NOTE — Consult Note (Signed)
Short note:  Called by Epilepsy Neurologist reading EEG's, concern for frequent electrographic seizure activity on LTM  - Came to see patient clinically, awake answers questions when stimulated, confused, moves all extremities equally  Rec's  - Dilantin Load 20mg /kg x 1 - Dilantin 100mg  IV TID - Check dilantin level in the am

## 2017-02-08 ENCOUNTER — Inpatient Hospital Stay (HOSPITAL_COMMUNITY): Payer: Self-pay

## 2017-02-08 DIAGNOSIS — Z87898 Personal history of other specified conditions: Secondary | ICD-10-CM | POA: Diagnosis not present

## 2017-02-08 DIAGNOSIS — R569 Unspecified convulsions: Secondary | ICD-10-CM

## 2017-02-08 DIAGNOSIS — G9341 Metabolic encephalopathy: Secondary | ICD-10-CM | POA: Diagnosis not present

## 2017-02-08 DIAGNOSIS — E871 Hypo-osmolality and hyponatremia: Secondary | ICD-10-CM | POA: Diagnosis not present

## 2017-02-08 DIAGNOSIS — F321 Major depressive disorder, single episode, moderate: Secondary | ICD-10-CM

## 2017-02-08 DIAGNOSIS — W19XXXA Unspecified fall, initial encounter: Secondary | ICD-10-CM | POA: Diagnosis not present

## 2017-02-08 DIAGNOSIS — F419 Anxiety disorder, unspecified: Secondary | ICD-10-CM | POA: Diagnosis not present

## 2017-02-08 DIAGNOSIS — Z720 Tobacco use: Secondary | ICD-10-CM | POA: Diagnosis not present

## 2017-02-08 LAB — CBC
HEMATOCRIT: 34.7 % — AB (ref 39.0–52.0)
HEMOGLOBIN: 11.3 g/dL — AB (ref 13.0–17.0)
MCH: 29.4 pg (ref 26.0–34.0)
MCHC: 32.6 g/dL (ref 30.0–36.0)
MCV: 90.1 fL (ref 78.0–100.0)
Platelets: 295 10*3/uL (ref 150–400)
RBC: 3.85 MIL/uL — ABNORMAL LOW (ref 4.22–5.81)
RDW: 15 % (ref 11.5–15.5)
WBC: 11.6 10*3/uL — ABNORMAL HIGH (ref 4.0–10.5)

## 2017-02-08 LAB — MRSA CULTURE: Culture: NOT DETECTED

## 2017-02-08 LAB — COMPREHENSIVE METABOLIC PANEL
ALK PHOS: 65 U/L (ref 38–126)
ALT: 16 U/L — ABNORMAL LOW (ref 17–63)
ANION GAP: 9 (ref 5–15)
AST: 29 U/L (ref 15–41)
Albumin: 3.2 g/dL — ABNORMAL LOW (ref 3.5–5.0)
BUN: 6 mg/dL (ref 6–20)
CALCIUM: 8.9 mg/dL (ref 8.9–10.3)
CO2: 21 mmol/L — AB (ref 22–32)
Chloride: 102 mmol/L (ref 101–111)
Creatinine, Ser: 0.88 mg/dL (ref 0.61–1.24)
GFR calc Af Amer: >60 mL/min (ref >60)
GFR calc non Af Amer: >60 mL/min (ref >60)
GLUCOSE: 71 mg/dL (ref 65–99)
POTASSIUM: 4.5 mmol/L (ref 3.5–5.1)
Sodium: 132 mmol/L — ABNORMAL LOW (ref 135–145)
Total Bilirubin: 0.7 mg/dL (ref 0.3–1.2)
Total Protein: 6 g/dL — ABNORMAL LOW (ref 6.5–8.1)

## 2017-02-08 LAB — HEMOGLOBIN A1C
HEMOGLOBIN A1C: 5.8 % — AB (ref 4.8–5.6)
MEAN PLASMA GLUCOSE: 120 mg/dL

## 2017-02-08 LAB — MAGNESIUM: Magnesium: 1.8 mg/dL (ref 1.7–2.4)

## 2017-02-08 LAB — PHENYTOIN LEVEL, TOTAL: Phenytoin Lvl: 25.3 ug/mL — ABNORMAL HIGH (ref 10.0–20.0)

## 2017-02-08 MED ORDER — GADOBENATE DIMEGLUMINE 529 MG/ML IV SOLN
15.0000 mL | Freq: Once | INTRAVENOUS | Status: AC
  Administered 2017-02-08: 11 mL via INTRAVENOUS

## 2017-02-08 NOTE — Progress Notes (Signed)
LTM taken down per Neurology. No skin breakdown seen

## 2017-02-08 NOTE — Progress Notes (Signed)
Subjective: Interval History: No witnessed seizures.  Several scalp EEG electrodes have come off.    Objective: Vital signs in last 24 hours: Temp:  [97.2 F (36.2 C)-98.7 F (37.1 C)] 97.8 F (36.6 C) (06/30 0707) Pulse Rate:  [59-67] 64 (06/30 0707) Resp:  [13-20] 14 (06/30 0707) BP: (105-152)/(55-68) 146/55 (06/30 0707) SpO2:  [97 %-100 %] 97 % (06/30 0707)  Intake/Output from previous day: 06/29 0701 - 06/30 0700 In: 2500 [I.V.:2500] Out: 875 [Urine:875] Intake/Output this shift: No intake/output data recorded. Nutritional status: Diet NPO time specified  Neurologic Exam:  Awake, alert, oriented to year, month, city, but thinks it is 29th and is at home.   No focal abnormalities on exam.  Bedside continuous EEG shows 7-8 Hz symmetrical frequencies.  No epileptiform discharges or electrographic seizures.  Quality compromised by some electrodes having come off.    Lab Results:  Recent Labs  02/07/17 0612  02/07/17 1648 02/08/17 0210  WBC 9.4  --   --  11.6*  HGB 12.2*  --   --  11.3*  HCT 36.5*  --   --  34.7*  PLT 349  --   --  295  NA 131*  < > 129* 132*  K 4.0  < > 4.2 4.5  CL 100*  < > 97* 102  CO2 22  < > 19* 21*  GLUCOSE 81  < > 88 71  BUN 8  < > 8 6  CREATININE 0.91  < > 0.80 0.88  CALCIUM 9.1  < > 9.4 8.9  < > = values in this interval not displayed. Lipid Panel  Recent Labs  02/07/17 0612  CHOL 239*  TRIG 142  HDL 61  CHOLHDL 3.9  VLDL 28  LDLCALC 829*    Studies/Results: Ct Head Wo Contrast  Result Date: 02/06/2017 CLINICAL DATA:  71 year old male status post fall yesterday with subsequent confusion. Right supraorbital hematoma. On blood thinners. EXAM: CT HEAD WITHOUT CONTRAST TECHNIQUE: Contiguous axial images were obtained from the base of the skull through the vertex without intravenous contrast. COMPARISON:  None. FINDINGS: Brain: No midline shift, ventriculomegaly, mass effect, evidence of mass lesion, intracranial hemorrhage or  evidence of cortically based acute infarction. No cortical encephalomalacia. Normal for age gray-white matter differentiation. Vascular: Calcified atherosclerosis at the skull base. No suspicious intracranial vascular hyperdensity. Skull: No fracture identified. No acute osseous abnormality identified. Sinuses/Orbits: Visualized paranasal sinuses and mastoids are well pneumatized. Other: No discrete scalp hematoma identified. Visible orbit and face soft tissues appear within normal limits. IMPRESSION: No acute intracranial abnormality or acute traumatic injury identified. Normal for age noncontrast CT appearance of the brain. Electronically Signed   By: Odessa Fleming M.D.   On: 02/06/2017 17:19    Medications:  Scheduled: .  stroke: mapping our early stages of recovery book   Does not apply Once  . enoxaparin (LOVENOX) injection  40 mg Subcutaneous Q24H  . folic acid  1 mg Intravenous Daily  . mouth rinse  15 mL Mouth Rinse BID  . nicotine  21 mg Transdermal Daily  . phenytoin (DILANTIN) IV  100 mg Intravenous Q8H  . pneumococcal 23 valent vaccine  0.5 mL Intramuscular Tomorrow-1000  . sodium chloride flush  3 mL Intravenous Q12H  . thiamine  100 mg Intravenous Daily    Assessment/Plan:  Status epilepticus- resolved clinically and by EEG.  Mild residual confusion.  I expect him to steadily improve.  I will change to oral AED once he is  more oriented and alert.    We can stop the continuous EEG today.     LOS: 1 day   Weston SettleESHRAGHI, Lovada Barwick, MD 02/08/2017  8:50 AM

## 2017-02-08 NOTE — Progress Notes (Signed)
OT Cancellation Note  Patient Details Name: Lorretta Harporman B Hasegawa MRN: [id number] DOB: [birth year]   Cancelled Treatment:    Reason Eval/Treat Not Completed: Other (comment). Pt with active bedrest orders. Please update activity orders when appropriate for therapy. thanks  [facility] LPWARD,HILLARY  Wynette Jersey, OT/L  [phone] 02/08/2017 02/08/2017, 7:17 AM

## 2017-02-08 NOTE — Progress Notes (Signed)
PROGRESS NOTE    NOA CONSTANTE  ZOX:[id number] DOB: 06-06-1946 DOA: 02/06/2017 PCP: [name], NP   Brief Narrative:  71 y.o. WM PMHx Anxiety, HTN, Depression,PTSD, PUD with bleeding, Tobacco abuse, EtOH abuse in remission since 2009, who presents with altered mental status, difficult speaking and fall.  Per his daugther, pt was on Xanaxa and Ambien, his doctor abruptly stopped his Xanax and Ambien 2 weeks ago, but pt still takes approximately half dose of Xanax currently. Daughter states that the patient became confused in the past 2 days and more agitated. Pt had fall and had right eye bruise, but his daughter is not sure when it happened. Pt thinks that he is on a blood thinner that when asked which blood thinner he takes, he states "bh+." He is unable to provide more history. Daughter states that the patient had one episode of difficult speaking which lasted for about 2 seconds. Patient does not have unilateral weakness, numbness or tingliness in extremities, no facial droop, slurred speech, vision change or hearing loss. Patient does not have chest pain, SOB, cough, fever or chills. No nausea, vomiting, diarrhea, abdominal pain, symptoms of UTI.   ED Course: pt was found to have WBC 7.0, sodium 129, creatinine normal, CK 427, alcohol less than 5, UDS positive for THC and Benzo, temperature normal, tachycardia, O2 sats 98% on room air, negative CT head for acute intracranial abnormalities. Pt is placed on tele bed for obs. Neurology, Dr. Otelia Limes was consulted.    Subjective: 6/30  A/O 3 (patient does not know why). Per RN waxes and wanes (at times hallucinating and talking to people who were not in the room). Negative CP, negative SOB, negative SOB.     Assessment & Plan: Acute metabolic encephalopathy:  -Etiology is not clear.  -Per Daughter Xanax and Ambien were abruptly discontinued by his doctor, but pt is still taking approximately half dose of Xanax, and his UDS is  positive for be. -ess likely to have acute distress. 3 Other differential diagnosis includes syncope, TIA given short episode of difficult speaking, seizure, substance use (marijuana positive). Hyponatremia may have also contributed partially. Neurology, Dr. Otelia Limes was consulted -Continue seizure precaution -Continue physical sitter - 6/29 Hemoglobin A1c = 5.8 check A1c - PT/OT eval and treat -Doubt hyponatremia contributing factor: Only slightly decreased. -B 12/TSH WNL -Continue thiamine supplementation -Per Neurology recommendation Will obtain thoracic MRI to assess for possible myelopathy given continued confusion - f/u neurology's recommendation  Seizures -Per Neurology workup patient with seizures have resolved, now diffuse encephalopathy see EEG results below -Continue Dilantin per neurology  HLD    Fall: -CT-head negative.  Hyponatremia:  -Likely multifactorial etiology, including poor oral intake, dehydration and Prinzide use, or thyroid dysfunction -Normal saline 153ml/hr -Improving - Will check urine sodium, urine osmolality, serum osmolality.  Anxiety and depression:  -Patient is not taking Prozac and Wellbutrin. No SI or HI ideation -Even though EtOH on admission negative patient has history EtOH abuse, continued CIWA protocol  Hypertension: -hold lisinopril due to hyponatremia -Currently stable without medication continue monitor  GERD: -Protonix  Tobacco abuse  -Did counseling about importance of quitting smoking -Nicotine patch  EtOH abuse -pt stopped drinking alcohol 2009 -CIWA protocol        DVT prophylaxis: Lovenox Code Status: Full Family Communication: None Disposition Plan: Per neurology   Consultants:  Neurology    Procedures/Significant Events:  6/28 CT head Wo contrast: Negative acute abnormality 6/29 EEG: This awake and drowsy EEG is abnormal.  Multiple clinicoelectrographic seizures consistent with status epilepticus.  Initially seizures arise from the left posterior quadrant, then as the study progresses, epileptiform discharges are seen in a generalized fashion. He is seen to have several episodes of forced head turn to the right and was confused throughout the study.  6/29 overnight EEG: indicative of a mild diffuse encephalopathy, non-specific as to etiology   VENTILATOR SETTINGS:    Cultures   Antimicrobials: Anti-infectives    None       Devices    LINES / TUBES:      Continuous Infusions: . sodium chloride 125 mL/hr at 02/08/17 0136     Objective: Vitals:   02/07/17 1957 02/07/17 2351 02/08/17 0324 02/08/17 0707  BP: 135/67 (!) 130/59 105/60 (!) 146/55  Pulse: (!) 59 65 62 64  Resp:  17 16 14   Temp:  98 F (36.7 C) 98.5 F (36.9 C) 97.8 F (36.6 C)  TempSrc:  Oral Oral Oral  SpO2:  99% 100% 97%  Weight:      Height:        Intake/Output Summary (Last 24 hours) at 02/08/17 0804 Last data filed at 02/08/17 0300  Gross per 24 hour  Intake             2500 ml  Output              875 ml  Net             1625 ml   Filed Weights   02/06/17 1534 02/06/17 2352  Weight: 114 lb (51.7 kg) 115 lb 11.9 oz (52.5 kg)    Examination:  General: A/O 3, (does not know why) No acute respiratory distress, cachectic Eyes: negative scleral hemorrhage, negative anisocoria, negative icterus ENT: Negative Runny nose, negative gingival bleeding, Neck:  Negative scars, masses, torticollis, lymphadenopathy, JVD Lungs: Clear to auscultation bilaterally without wheezes or crackles Cardiovascular: Regular rate and rhythm without murmur gallop or rub normal S1 and S2 Abdomen: negative abdominal pain, nondistended, positive soft, bowel sounds, no rebound, no ascites, no appreciable mass Extremities: No significant cyanosis, clubbing, or edema bilateral lower extremities Skin: Negative rashes, lesions, ulcers Psychiatric:  Unable to fully assess secondary to patient's altered mental  status   Central nervous system:  Cranial nerves II through XII intact, tongue/uvula midline, all extremities muscle strength 5/5, sensation intact throughout,negative dysarthria, negative expressive aphasia, negative receptive aphasia.  .     Data Reviewed: Care during the described time interval was provided by me .  I have reviewed this patient's available data, including medical history, events of note, physical examination, and all test results as part of my evaluation. I have personally reviewed and interpreted all radiology studies.  CBC:  Recent Labs Lab 02/06/17 1647 02/07/17 0612 02/08/17 0210  WBC 7.0 9.4 11.6*  NEUTROABS 5.3  --   --   HGB 11.9* 12.2* 11.3*  HCT 35.7* 36.5* 34.7*  MCV 89.3 89.7 90.1  PLT 323 349 295   Basic Metabolic Panel:  Recent Labs Lab 02/06/17 2354 02/07/17 0612 02/07/17 1242 02/07/17 1648 02/08/17 0210  NA 129* 131* 129* 129* 132*  K 4.8 4.0 4.4 4.2 4.5  CL 97* 100* 99* 97* 102  CO2 25 22 20* 19* 21*  GLUCOSE 105* 81 98 88 71  BUN 12 8 8 8 6   CREATININE 1.04 0.91 0.81 0.80 0.88  CALCIUM 9.5 9.1 9.6 9.4 8.9  MG  --   --   --   --  1.8   GFR: Estimated Creatinine Clearance: 58.8 mL/min (by C-G formula based on SCr of 0.88 mg/dL). Liver Function Tests:  Recent Labs Lab 02/06/17 1647 02/08/17 0210  AST 31 29  ALT 17 16*  ALKPHOS 75 65  BILITOT 0.5 0.7  PROT 6.6 6.0*  ALBUMIN 3.5 3.2*   No results for input(s): LIPASE, AMYLASE in the last 168 hours.  Recent Labs Lab 02/06/17 1647  AMMONIA 16   Coagulation Profile: No results for input(s): INR, PROTIME in the last 168 hours. Cardiac Enzymes:  Recent Labs Lab 02/06/17 1647 02/06/17 2354 02/07/17 0612 02/07/17 1242  CKTOTAL 427*  --   --   --   TROPONINI  --  <0.03 <0.03 <0.03   BNP (last 3 results) No results for input(s): PROBNP in the last 8760 hours. HbA1C:  Recent Labs  02/07/17 0612  HGBA1C 5.8*   CBG:  Recent Labs Lab 02/06/17 1654  02/06/17 2138  GLUCAP 70 209*   Lipid Profile:  Recent Labs  02/07/17 0612  CHOL 239*  HDL 61  LDLCALC 150*  TRIG 142  CHOLHDL 3.9   Thyroid Function Tests:  Recent Labs  02/06/17 2354  TSH 0.222*   Anemia Panel:  Recent Labs  02/07/17 1242  VITAMINB12 581   Urine analysis:    Component Value Date/Time   COLORURINE YELLOW 02/06/2017 1805   APPEARANCEUR CLEAR 02/06/2017 1805   LABSPEC 1.024 02/06/2017 1805   PHURINE 5.0 02/06/2017 1805   GLUCOSEU NEGATIVE 02/06/2017 1805   HGBUR NEGATIVE 02/06/2017 1805   BILIRUBINUR NEGATIVE 02/06/2017 1805   KETONESUR 20 (A) 02/06/2017 1805   PROTEINUR NEGATIVE 02/06/2017 1805   NITRITE NEGATIVE 02/06/2017 1805   LEUKOCYTESUR NEGATIVE 02/06/2017 1805   Sepsis Labs: @LABRCNTIP (procalcitonin:4,lacticidven:4)  ) Recent Results (from the past 240 hour(s))  MRSA PCR Screening     Status: Abnormal   Collection Time: 02/07/17 12:01 AM  Result Value Ref Range Status   MRSA by PCR INVALID RESULTS, SPECIMEN SENT FOR CULTURE (A) NEGATIVE Final    Comment: CALLED TO J.TERRY,RN AT 0345 BY L.PITT 02/08/17        The GeneXpert MRSA Assay (FDA approved for NASAL specimens only), is one component of a comprehensive MRSA colonization surveillance program. It is not intended to diagnose MRSA infection nor to guide or monitor treatment for MRSA infections.   MRSA culture     Status: None (Preliminary result)   Collection Time: 02/07/17 12:01 AM  Result Value Ref Range Status   Specimen Description NASAL SWAB  Final   Special Requests NONE  Final   Culture TOO YOUNG TO READ  Final   Report Status PENDING  Incomplete         Radiology Studies: Ct Head Wo Contrast  Result Date: 02/06/2017 CLINICAL DATA:  71 year old male status post fall yesterday with subsequent confusion. Right supraorbital hematoma. On blood thinners. EXAM: CT HEAD WITHOUT CONTRAST TECHNIQUE: Contiguous axial images were obtained from the base of the skull  through the vertex without intravenous contrast. COMPARISON:  None. FINDINGS: Brain: No midline shift, ventriculomegaly, mass effect, evidence of mass lesion, intracranial hemorrhage or evidence of cortically based acute infarction. No cortical encephalomalacia. Normal for age gray-white matter differentiation. Vascular: Calcified atherosclerosis at the skull base. No suspicious intracranial vascular hyperdensity. Skull: No fracture identified. No acute osseous abnormality identified. Sinuses/Orbits: Visualized paranasal sinuses and mastoids are well pneumatized. Other: No discrete scalp hematoma identified. Visible orbit and face soft tissues appear within normal limits. IMPRESSION: No acute  intracranial abnormality or acute traumatic injury identified. Normal for age noncontrast CT appearance of the brain. Electronically Signed   By: Odessa Fleming M.D.   On: 02/06/2017 17:19        Scheduled Meds: .  stroke: mapping our early stages of recovery book   Does not apply Once  . enoxaparin (LOVENOX) injection  40 mg Subcutaneous Q24H  . folic acid  1 mg Intravenous Daily  . mouth rinse  15 mL Mouth Rinse BID  . nicotine  21 mg Transdermal Daily  . phenytoin (DILANTIN) IV  100 mg Intravenous Q8H  . pneumococcal 23 valent vaccine  0.5 mL Intramuscular Tomorrow-1000  . sodium chloride flush  3 mL Intravenous Q12H  . thiamine  100 mg Intravenous Daily   Continuous Infusions: . sodium chloride 125 mL/hr at 02/08/17 0136     LOS: 1 day    Time spent: 40 minutes    WOODS, Roselind Messier, MD [facility] Pager [phone]   If 7PM-7AM, please contact night-coverage [email or web address] Password [email or web address] 02/08/2017, 8:04 AM

## 2017-02-08 NOTE — Progress Notes (Signed)
PT Cancellation Note  Patient Details Name: Blake Little B [place] MRN: [id number] DOB: 1945/11/24   Cancelled Treatment:    Reason Eval/Treat Not Completed: Medical issues which prohibited therapy;Other (comment) (Pt is on continuous EEG and bed rest. Will check back once pt is medically ready )   Colin BroachSabra M. Tarique Loveall PT, DPT  [phone]  02/08/2017, 9:03 AM

## 2017-02-08 NOTE — Procedures (Signed)
  Video EEG Monitoring Report    Dates of monitoring:   02/07/17 @09 :54 to 02/08/17 @07 :30  Recording day:    1 (started on 6/29) EEG Number:    none provided Requesting provider:   Kathreen CornfieldAleks Shikhman, MD Interpreting physician:  Wynelle Bourgeoisan-Andrei Lavelle Akel, MD  CPT:  [phone] ICD-10:   5340.901   Present History: 71 year old man who presented with altered mental status and had a witnessed convulsive event during an MRI. Continuous VEEG requested to evaluate for ongoing seizures   EEG Classification Abnormal, Awake and sleep  PDR 9 Hz --> 7-8 Hz, reactive and symmetric  HR 70 bpm and regular   Non-epileptiform abnormalities: 1. Intermittent slow, generalized (GRDA)  Interictal epileptiform abnormalities: 1. ?polyspikes, generalized  Ictal phenomena:  none  EEG DETAILS:  TYPE OF RECORDING: At least 18-channel digital EEG with using a standard international 10-20 placement with additional EEG electrodes, and 1 additional channel dedicated to the EKG, at a sampling rate of at least 256 Hz. Video was recorded throughout the study. The recording was interpreted using digital review software allowing for montage reformatting, gain and filter changes. Each page was reviewed manually. Automatic spike and seizure detection software and quantitative analysis tools were used as needed.   Description of EEG features: The EEG opens with a relatively normal awake background. Normal voltage, mixed frequency rhythms are seen with a dominant alpha-theta background and superimposed beta activity which has normal amplitude. A posterior dominant rhythm of 9 Hz is observed, which is symmetric and attenuates normally with eye opening.   Then, there is a period of ~15 minutes during which the recording alternates between bursts of high amplitude relatively symmetric arciform polyspikes and rhythmic diffuse slowing. There are no obvious clinical correlates. Subsequently, no such findings are seen.   Later in the  recording, there is an increase in the diffuse intermittent delta slowing, and the posterior background drops to 7 Hz.   Drowsiness is accompanied by the usual EEG correlates including roving and then absent eye movements, progressively fewer eye blinks, waxing and waning PDR and disappearance of the PDR, generalized slow activity, and central theta activity.  Normal stage II and slow wave sleep are recorded. Stage II is characterized by normal appearing, symmetric vertex waves, spindles and K-complexes.   After 05:51, when the patient awakens from sleep, the recording becomes technically difficult because numerous electrodes become compromised, especially over the right hemisphere.   Push Button Events: none  Interpretation: This EEG opens with an unusual pattern of diffuse polyspikes alternating with diffuse generalized rhythmic delta activity that may be indicative of diffuse cortical irritability and lies on an ictal-interictal continuum. There is no obvious focality. This alternated with normal awake EEG and then disappeared after the first hour.   Subsequently, the EEG is indicative of a mild diffuse encephalopathy, non-specific as to etiology. No further interictal discharges or seizures are seen.

## 2017-02-09 DIAGNOSIS — I10 Essential (primary) hypertension: Secondary | ICD-10-CM | POA: Diagnosis not present

## 2017-02-09 DIAGNOSIS — Z720 Tobacco use: Secondary | ICD-10-CM | POA: Diagnosis not present

## 2017-02-09 DIAGNOSIS — K228 Other specified diseases of esophagus: Secondary | ICD-10-CM | POA: Diagnosis not present

## 2017-02-09 DIAGNOSIS — F10129 Alcohol abuse with intoxication, unspecified: Secondary | ICD-10-CM

## 2017-02-09 DIAGNOSIS — R569 Unspecified convulsions: Secondary | ICD-10-CM | POA: Diagnosis not present

## 2017-02-09 DIAGNOSIS — E784 Other hyperlipidemia: Secondary | ICD-10-CM

## 2017-02-09 DIAGNOSIS — G9341 Metabolic encephalopathy: Secondary | ICD-10-CM | POA: Diagnosis not present

## 2017-02-09 DIAGNOSIS — F419 Anxiety disorder, unspecified: Secondary | ICD-10-CM | POA: Diagnosis not present

## 2017-02-09 LAB — CBC
HEMATOCRIT: 33.1 % — AB (ref 39.0–52.0)
Hemoglobin: 10.7 g/dL — ABNORMAL LOW (ref 13.0–17.0)
MCH: 28.9 pg (ref 26.0–34.0)
MCHC: 32.3 g/dL (ref 30.0–36.0)
MCV: 89.5 fL (ref 78.0–100.0)
Platelets: 334 10*3/uL (ref 150–400)
RBC: 3.7 MIL/uL — ABNORMAL LOW (ref 4.22–5.81)
RDW: 14.8 % (ref 11.5–15.5)
WBC: 12.4 10*3/uL — AB (ref 4.0–10.5)

## 2017-02-09 LAB — COMPREHENSIVE METABOLIC PANEL
ALT: 14 U/L — ABNORMAL LOW (ref 17–63)
ANION GAP: 9 (ref 5–15)
AST: 21 U/L (ref 15–41)
Albumin: 2.7 g/dL — ABNORMAL LOW (ref 3.5–5.0)
Alkaline Phosphatase: 67 U/L (ref 38–126)
BILIRUBIN TOTAL: 1 mg/dL (ref 0.3–1.2)
BUN: 9 mg/dL (ref 6–20)
CO2: 21 mmol/L — ABNORMAL LOW (ref 22–32)
Calcium: 8.6 mg/dL — ABNORMAL LOW (ref 8.9–10.3)
Chloride: 100 mmol/L — ABNORMAL LOW (ref 101–111)
Creatinine, Ser: 1.03 mg/dL (ref 0.61–1.24)
GFR calc Af Amer: >60 mL/min (ref >60)
Glucose, Bld: 54 mg/dL — ABNORMAL LOW (ref 65–99)
POTASSIUM: 3.8 mmol/L (ref 3.5–5.1)
Sodium: 130 mmol/L — ABNORMAL LOW (ref 135–145)
TOTAL PROTEIN: 5.7 g/dL — AB (ref 6.5–8.1)

## 2017-02-09 LAB — MAGNESIUM: Magnesium: 1.6 mg/dL — ABNORMAL LOW (ref 1.7–2.4)

## 2017-02-09 MED ORDER — MAGNESIUM SULFATE 2 GM/50ML IV SOLN
2.0000 g | Freq: Once | INTRAVENOUS | Status: AC
  Administered 2017-02-09: 2 g via INTRAVENOUS
  Filled 2017-02-09: qty 50

## 2017-02-09 MED ORDER — FOLIC ACID 5 MG/ML IJ SOLN
1.0000 mg | Freq: Every day | INTRAMUSCULAR | Status: DC
  Filled 2017-02-09: qty 0.2

## 2017-02-09 MED ORDER — LEVETIRACETAM 500 MG PO TABS
500.0000 mg | ORAL_TABLET | Freq: Two times a day (BID) | ORAL | Status: DC
  Administered 2017-02-09 – 2017-02-11 (×5): 500 mg via ORAL
  Filled 2017-02-09 (×5): qty 1

## 2017-02-09 MED ORDER — THIAMINE HCL 100 MG/ML IJ SOLN: 100.0000 mg | Freq: Every day | INTRAMUSCULAR | Status: DC

## 2017-02-09 MED ORDER — FOLIC ACID 1 MG PO TABS
1.0000 mg | ORAL_TABLET | Freq: Every day | ORAL | Status: DC
  Administered 2017-02-10 – 2017-02-11 (×2): 1 mg via ORAL
  Filled 2017-02-09 (×2): qty 1

## 2017-02-09 MED ORDER — PANTOPRAZOLE SODIUM 40 MG PO TBEC
40.0000 mg | DELAYED_RELEASE_TABLET | Freq: Every day | ORAL | Status: DC
  Administered 2017-02-09 – 2017-02-11 (×3): 40 mg via ORAL
  Filled 2017-02-09 (×3): qty 1

## 2017-02-09 MED ORDER — VITAMIN B-1 100 MG PO TABS
100.0000 mg | ORAL_TABLET | Freq: Every day | ORAL | Status: DC
  Administered 2017-02-10 – 2017-02-11 (×2): 100 mg via ORAL
  Filled 2017-02-09 (×2): qty 1

## 2017-02-09 NOTE — Progress Notes (Signed)
PT Cancellation Note  Patient Details Name: Blake Little MRN: [id number] DOB: Nov 19, 1945   Cancelled Treatment:    Reason Eval/Treat Not Completed: Medical issues which prohibited therapy.  Pt is on active bedrest and will check back as time and pt allow.   Ivar DrapeRuth E Deniel Mcquiston 02/09/2017, 1:43 PM   Samul Dadauth Shanaiya Bene, PT MS Acute Rehab Dept. Number: [facility] [phone] and [facility] [phone]

## 2017-02-09 NOTE — Progress Notes (Signed)
OT Cancellation Note  Patient Details Name: Blake Little MRN: [id number] DOB: 08/25/1945   Cancelled Treatment:    Reason Eval/Treat Not Completed: Patient not medically ready (active bedrest orders). Will follow up as time allows.  [name] A [name] M.S., OTR/L Pager: [phone]  02/09/2017, 8:00 AM

## 2017-02-09 NOTE — Progress Notes (Signed)
Subjective: Interval History: No seizures.  Objective: Vital signs in last 24 hours: Temp:  [98 F (36.7 C)-98.7 F (37.1 C)] 98 F (36.7 C) (07/01 0250) Pulse Rate:  [68-78] 68 (07/01 0400) Resp:  [15-22] 15 (07/01 0400) BP: (135-166)/(58-68) 159/59 (07/01 0400) SpO2:  [96 %-100 %] 96 % (07/01 0400)  Intake/Output from previous day: 06/30 0701 - 07/01 0700 In: 3125 [I.V.:3125] Out: 1550 [Urine:1550] Intake/Output this shift: No intake/output data recorded. Nutritional status: Diet NPO time specified  Neurologic Exam:  Awake, alert, fully oriented to year,  Month, date, day, and place.  Language- fluent. C/N/R- intact. Motor 5/5 BUE and BLE. Sensory and Coord- intact.  Lab Results:  Recent Labs  02/08/17 0210 02/09/17 0314  WBC 11.6* 12.4*  HGB 11.3* 10.7*  HCT 34.7* 33.1*  PLT 295 334  NA 132* 130*  K 4.5 3.8  CL 102 100*  CO2 21* 21*  GLUCOSE 71 54*  BUN 6 9  CREATININE 0.88 1.03  CALCIUM 8.9 8.6*   Lipid Panel  Recent Labs  02/07/17 0612  CHOL 239*  TRIG 142  HDL 61  CHOLHDL 3.9  VLDL 28  LDLCALC 161150*    Studies/Results: Mr Thoracic Spine W Wo Contrast  Result Date: 02/08/2017 CLINICAL DATA:  Myelopathic symptoms. EXAM: MRI THORACIC WITHOUT AND WITH CONTRAST TECHNIQUE: Multiplanar and multiecho pulse sequences of the thoracic spine were obtained without and with intravenous contrast. CONTRAST:  11mL MULTIHANCE GADOBENATE DIMEGLUMINE 529 MG/ML IV SOLN COMPARISON:  CT scan 12/08/2014 FINDINGS: MRI THORACIC SPINE FINDINGS Alignment:  Normal Vertebrae: Normal marrow signal. No bone lesions or fracture. No worrisome marrow process. No areas of abnormal contrast enhancement. Cord: Normal MR appearance of the thoracic spinal cord. No cord lesions, syrinx or abnormal cord enhancement. Paraspinal and other soft tissues: The upper esophagus is somewhat patulous. The mid distal esophagus demonstrates moderate wall thickening and enhancement particularly at the  T11-12 level just above the GE junction. This could be due to reflux esophagitis but could not exclude infiltrating tumor. Recommend correlation with endoscopy or barium esophagram. No paraesophageal lymphadenopathy. The aorta is normal in caliber. Evidence of prior surgery at the GE junction based on the prior CT scan. Disc levels: The spinal canal is generous in is no significant spinal or foraminal stenosis identified. Tiny disc protrusions and spurring changes noted at T4-5, T8-9 and T10-11. IMPRESSION: 1. Normal alignment and no acute bony findings or worrisome bone lesions. 2. Normal MR appearance of the thoracic spinal cord. No cord lesions or abnormal cord enhancement. 3. Small disc protrusions and osteophytic spurring at T4-5, T8-9 and T10-11 but no neural compression. 4. Moderate wall thickening of the distal esophagus and enhancement. This could be esophagitis but could not exclude infiltrating tumor. Radiation changes are also possible. Recommend correlation with endoscopy or barium esophagram. Electronically Signed   By: Rudie MeyerP.  Gallerani M.D.   On: 02/08/2017 16:51    Medications:  Scheduled: .  stroke: mapping our early stages of recovery book   Does not apply Once  . enoxaparin (LOVENOX) injection  40 mg Subcutaneous Q24H  . folic acid  1 mg Intravenous Daily  . levETIRAcetam  500 mg Oral BID  . mouth rinse  15 mL Mouth Rinse BID  . nicotine  21 mg Transdermal Daily  . pneumococcal 23 valent vaccine  0.5 mL Intramuscular Tomorrow-1000  . sodium chloride flush  3 mL Intravenous Q12H  . thiamine  100 mg Intravenous Daily    Assessment/Plan:  Status epilepticus -  resolved.  I will switch his IV AED to PO.  However, Dilantin is not desirable for long term treatment given its significant adverse effect profile.  Therefore, I will switch him to Keppra.  He can continue that and follow up in Neurology clinic after discharge.  Will sign off.     LOS: 2 days   Weston Settle, MD 02/09/2017   8:23 AM

## 2017-02-09 NOTE — Progress Notes (Signed)
PROGRESS NOTE    Blake Little  WUJ:[id number] DOB: April 24, 1946 DOA: 02/06/2017 PCP: [name], NP   Brief Narrative:  71 y.o. WM PMHx Anxiety, HTN, Depression,PTSD, PUD with bleeding, Tobacco abuse, EtOH abuse in remission since 2009, who presents with altered mental status, difficult speaking and fall.  Per his daugther, pt was on Xanaxa and Ambien, his doctor abruptly stopped his Xanax and Ambien 2 weeks ago, but pt still takes approximately half dose of Xanax currently. Daughter states that the patient became confused in the past 2 days and more agitated. Pt had fall and had right eye bruise, but his daughter is not sure when it happened. Pt thinks that he is on a blood thinner that when asked which blood thinner he takes, he states "bh+." He is unable to provide more history. Daughter states that the patient had one episode of difficult speaking which lasted for about 2 seconds. Patient does not have unilateral weakness, numbness or tingliness in extremities, no facial droop, slurred speech, vision change or hearing loss. Patient does not have chest pain, SOB, cough, fever or chills. No nausea, vomiting, diarrhea, abdominal pain, symptoms of UTI.   ED Course: pt was found to have WBC 7.0, sodium 129, creatinine normal, CK 427, alcohol less than 5, UDS positive for THC and Benzo, temperature normal, tachycardia, O2 sats 98% on room air, negative CT head for acute intracranial abnormalities. Pt is placed on tele bed for obs. Neurology, Dr. Otelia Limes was consulted.    Subjective: 7/1  A/O 3 (patient does not know why). For the first time patient remembers my name, can converse pleasantly and intelligently. Negative CP, negative SOB, negative SOB. Requests that he be allowed to take sharp. States never had seizures prior to this event (verified by his family)    Assessment & Plan: Acute metabolic encephalopathy:  -Etiology is not clear.  -Per Daughter Xanax and Ambien were  abruptly discontinued by his doctor, but pt is still taking approximately half dose of Xanax, and his UDS is positive for benzodiazepine. -Continue seizure precaution - 6/29 Hemoglobin A1c = 5.8 check A1c - PT/OT eval and treat pending -B 12/TSH WNL -Continue thiamine supplementation -Thoracic MRI to assess for possible myelopathy given continued confusion - f/u neurology's recommendation  Seizures -Per Neurology workup patient with seizures have resolved, now diffuse encephalopathy see EEG results below -Continue Dilantin per neurology  Anxiety/Depression -At length with family and patient concerning treatment for anxiety. Xanax SHOULD never be used long-term for anxiety. -If Neurology okays would start Prozac or Paxil. Do not start until they okay secondary to patient's seizures.  Esophagus Thickening  -T-spine MRI shows thickening of esophagus. It patient to remain in hospital consult GI. If patient to be discharged will require follow-up appointment for EGD/barium Esophagram.   HLD   Fall due to seizure: -CT-head negative.  Hyponatremia:  -Likely multifactorial etiology, including poor oral intake, dehydration and Prinzide use, or thyroid dysfunction -Improving Recent Labs Lab 02/07/17 0612 02/07/17 1242 02/07/17 1648 02/08/17 0210 02/09/17 0314  NA 131* 129* 129* 132* 130*   Hypomagnesemia -Magnesium IV 2 g  Hypertension: -hold lisinopril due to hyponatremia -Currently stable without medication continue monitor  GERD: -Protonix  Tobacco abuse  -Did counseling about importance of quitting smoking -Nicotine patch  EtOH abuse -pt stopped drinking alcohol 2009 -CIWA protocol        DVT prophylaxis: Lovenox Code Status: Full Family Communication: None Disposition Plan: Per neurology   Consultants:  Neurology  Procedures/Significant Events:  6/28 CT head Wo contrast: Negative acute abnormality 6/29 EEG: This awake and drowsy EEG is  abnormal. Multiple clinicoelectrographic seizures consistent with status epilepticus. Initially seizures arise from the left posterior quadrant, then as the study progresses, epileptiform discharges are seen in a generalized fashion. He is seen to have several episodes of forced head turn to the right and was confused throughout the study.  6/29 overnight EEG: indicative of a mild diffuse encephalopathy, non-specific as to etiology 6/30 Thoracic MRI:-Normal alignment and no acute bony findings or worrisome bone lesions. Negative cord lesions - Small disc protrusions and osteophytic spurring at T4-5, T8-9 andT10-11 but no neural compression. -Moderate wall thickening of the distal esophagus and enhancement. This could be esophagitis but could not exclude infiltrating tumor. Radiation changes are also possible. Recommend correlation with endoscopy or barium esophagram.  VENTILATOR SETTINGS:    Cultures   Antimicrobials: Anti-infectives    None       Devices    LINES / TUBES:      Continuous Infusions: . sodium chloride 125 mL/hr at 02/09/17 0523     Objective: Vitals:   02/08/17 2021 02/09/17 0011 02/09/17 0250 02/09/17 0400  BP: (!) 164/67 (!) 166/64 (!) 148/68 (!) 159/59  Pulse: 71 69 72 68  Resp: (!) 22 20 16 15   Temp: 98.1 F (36.7 C) 98.7 F (37.1 C) 98 F (36.7 C)   TempSrc: Oral Axillary Axillary   SpO2: 99% 98% 96% 96%  Weight:      Height:        Intake/Output Summary (Last 24 hours) at 02/09/17 0800 Last data filed at 02/09/17 0400  Gross per 24 hour  Intake             3125 ml  Output             1550 ml  Net             1575 ml   Filed Weights   02/06/17 1534 02/06/17 2352  Weight: 114 lb (51.7 kg) 115 lb 11.9 oz (52.5 kg)    Examination:  General: A/O 3, (does not know why), Does know my name, No acute respiratory distress, cachectic Eyes: negative scleral hemorrhage, negative anisocoria, negative icterus ENT: Negative Runny nose,  negative gingival bleeding, Neck:  Negative scars, masses, torticollis, lymphadenopathy, JVD Lungs: Clear to auscultation bilaterally without wheezes or crackles Cardiovascular: Regular rate and rhythm without murmur gallop or rub normal S1 and S2 Abdomen: negative abdominal pain, nondistended, positive soft, bowel sounds, no rebound, no ascites, no appreciable mass Extremities: No significant cyanosis, clubbing, or edema bilateral lower extremities Skin: Negative rashes, lesions, ulcers Psychiatric:  Unable to fully assess secondary to patient's altered mental status   Central nervous system:  Cranial nerves II through XII intact, tongue/uvula midline, all extremities muscle strength 5/5, sensation intact throughout,negative dysarthria, negative expressive aphasia, negative receptive aphasia.  .     Data Reviewed: Care during the described time interval was provided by me .  I have reviewed this patient's available data, including medical history, events of note, physical examination, and all test results as part of my evaluation. I have personally reviewed and interpreted all radiology studies.  CBC:  Recent Labs Lab 02/06/17 1647 02/07/17 0612 02/08/17 0210 02/09/17 0314  WBC 7.0 9.4 11.6* 12.4*  NEUTROABS 5.3  --   --   --   HGB 11.9* 12.2* 11.3* 10.7*  HCT 35.7* 36.5* 34.7* 33.1*  MCV 89.3 89.7 90.1  89.5  PLT 323 349 295 334   Basic Metabolic Panel:  Recent Labs Lab 02/07/17 0612 02/07/17 1242 02/07/17 1648 02/08/17 0210 02/09/17 0314  NA 131* 129* 129* 132* 130*  K 4.0 4.4 4.2 4.5 3.8  CL 100* 99* 97* 102 100*  CO2 22 20* 19* 21* 21*  GLUCOSE 81 98 88 71 54*  BUN 8 8 8 6 9   CREATININE 0.91 0.81 0.80 0.88 1.03  CALCIUM 9.1 9.6 9.4 8.9 8.6*  MG  --   --   --  1.8 1.6*   GFR: Estimated Creatinine Clearance: 50.3 mL/min (by C-G formula based on SCr of 1.03 mg/dL). Liver Function Tests:  Recent Labs Lab 02/06/17 1647 02/08/17 0210 02/09/17 0314  AST 31 29  21   ALT 17 16* 14*  ALKPHOS 75 65 67  BILITOT 0.5 0.7 1.0  PROT 6.6 6.0* 5.7*  ALBUMIN 3.5 3.2* 2.7*   No results for input(s): LIPASE, AMYLASE in the last 168 hours.  Recent Labs Lab 02/06/17 1647  AMMONIA 16   Coagulation Profile: No results for input(s): INR, PROTIME in the last 168 hours. Cardiac Enzymes:  Recent Labs Lab 02/06/17 1647 02/06/17 2354 02/07/17 0612 02/07/17 1242  CKTOTAL 427*  --   --   --   TROPONINI  --  <0.03 <0.03 <0.03   BNP (last 3 results) No results for input(s): PROBNP in the last 8760 hours. HbA1C:  Recent Labs  02/07/17 0612  HGBA1C 5.8*   CBG:  Recent Labs Lab 02/06/17 1654 02/06/17 2138  GLUCAP 70 209*   Lipid Profile:  Recent Labs  02/07/17 0612  CHOL 239*  HDL 61  LDLCALC 150*  TRIG 142  CHOLHDL 3.9   Thyroid Function Tests:  Recent Labs  02/06/17 2354  TSH 0.222*   Anemia Panel:  Recent Labs  02/07/17 1242  VITAMINB12 581   Urine analysis:    Component Value Date/Time   COLORURINE YELLOW 02/06/2017 1805   APPEARANCEUR CLEAR 02/06/2017 1805   LABSPEC 1.024 02/06/2017 1805   PHURINE 5.0 02/06/2017 1805   GLUCOSEU NEGATIVE 02/06/2017 1805   HGBUR NEGATIVE 02/06/2017 1805   BILIRUBINUR NEGATIVE 02/06/2017 1805   KETONESUR 20 (A) 02/06/2017 1805   PROTEINUR NEGATIVE 02/06/2017 1805   NITRITE NEGATIVE 02/06/2017 1805   LEUKOCYTESUR NEGATIVE 02/06/2017 1805   Sepsis Labs: @LABRCNTIP (procalcitonin:4,lacticidven:4)  ) Recent Results (from the past 240 hour(s))  MRSA PCR Screening     Status: Abnormal   Collection Time: 02/07/17 12:01 AM  Result Value Ref Range Status   MRSA by PCR INVALID RESULTS, SPECIMEN SENT FOR CULTURE (A) NEGATIVE Final    Comment: CALLED TO J.TERRY,RN AT 0345 BY L.PITT 02/08/17        The GeneXpert MRSA Assay (FDA approved for NASAL specimens only), is one component of a comprehensive MRSA colonization surveillance program. It is not intended to diagnose MRSA infection  nor to guide or monitor treatment for MRSA infections.   MRSA culture     Status: None   Collection Time: 02/07/17 12:01 AM  Result Value Ref Range Status   Specimen Description NASAL SWAB  Final   Special Requests NONE  Final   Culture NO MRSA DETECTED  Final   Report Status 02/08/2017 FINAL  Final         Radiology Studies: Mr Thoracic Spine W Wo Contrast  Result Date: 02/08/2017 CLINICAL DATA:  Myelopathic symptoms. EXAM: MRI THORACIC WITHOUT AND WITH CONTRAST TECHNIQUE: Multiplanar and multiecho pulse sequences of the thoracic  spine were obtained without and with intravenous contrast. CONTRAST:  11mL MULTIHANCE GADOBENATE DIMEGLUMINE 529 MG/ML IV SOLN COMPARISON:  CT scan 12/08/2014 FINDINGS: MRI THORACIC SPINE FINDINGS Alignment:  Normal Vertebrae: Normal marrow signal. No bone lesions or fracture. No worrisome marrow process. No areas of abnormal contrast enhancement. Cord: Normal MR appearance of the thoracic spinal cord. No cord lesions, syrinx or abnormal cord enhancement. Paraspinal and other soft tissues: The upper esophagus is somewhat patulous. The mid distal esophagus demonstrates moderate wall thickening and enhancement particularly at the T11-12 level just above the GE junction. This could be due to reflux esophagitis but could not exclude infiltrating tumor. Recommend correlation with endoscopy or barium esophagram. No paraesophageal lymphadenopathy. The aorta is normal in caliber. Evidence of prior surgery at the GE junction based on the prior CT scan. Disc levels: The spinal canal is generous in is no significant spinal or foraminal stenosis identified. Tiny disc protrusions and spurring changes noted at T4-5, T8-9 and T10-11. IMPRESSION: 1. Normal alignment and no acute bony findings or worrisome bone lesions. 2. Normal MR appearance of the thoracic spinal cord. No cord lesions or abnormal cord enhancement. 3. Small disc protrusions and osteophytic spurring at T4-5, T8-9 and  T10-11 but no neural compression. 4. Moderate wall thickening of the distal esophagus and enhancement. This could be esophagitis but could not exclude infiltrating tumor. Radiation changes are also possible. Recommend correlation with endoscopy or barium esophagram. Electronically Signed   By: Rudie Meyer M.D.   On: 02/08/2017 16:51        Scheduled Meds: .  stroke: mapping our early stages of recovery book   Does not apply Once  . enoxaparin (LOVENOX) injection  40 mg Subcutaneous Q24H  . folic acid  1 mg Intravenous Daily  . mouth rinse  15 mL Mouth Rinse BID  . nicotine  21 mg Transdermal Daily  . phenytoin (DILANTIN) IV  100 mg Intravenous Q8H  . pneumococcal 23 valent vaccine  0.5 mL Intramuscular Tomorrow-1000  . sodium chloride flush  3 mL Intravenous Q12H  . thiamine  100 mg Intravenous Daily   Continuous Infusions: . sodium chloride 125 mL/hr at 02/09/17 0523     LOS: 2 days    Time spent: 40 minutes    WOODS, Roselind Messier, MD [facility] Pager [phone]   If 7PM-7AM, please contact night-coverage [email or web address] Password [email or web address] 02/09/2017, 8:00 AM

## 2017-02-10 DIAGNOSIS — G9341 Metabolic encephalopathy: Secondary | ICD-10-CM | POA: Diagnosis not present

## 2017-02-10 LAB — MAGNESIUM: MAGNESIUM: 1.7 mg/dL (ref 1.7–2.4)

## 2017-02-10 LAB — COMPREHENSIVE METABOLIC PANEL
ALK PHOS: 68 U/L (ref 38–126)
ALT: 14 U/L — AB (ref 17–63)
AST: 17 U/L (ref 15–41)
Albumin: 2.7 g/dL — ABNORMAL LOW (ref 3.5–5.0)
Anion gap: 7 (ref 5–15)
BILIRUBIN TOTAL: 0.6 mg/dL (ref 0.3–1.2)
BUN: 8 mg/dL (ref 6–20)
CALCIUM: 8.4 mg/dL — AB (ref 8.9–10.3)
CHLORIDE: 100 mmol/L — AB (ref 101–111)
CO2: 24 mmol/L (ref 22–32)
CREATININE: 0.9 mg/dL (ref 0.61–1.24)
GFR calc Af Amer: >60 mL/min (ref >60)
Glucose, Bld: 78 mg/dL (ref 65–99)
Potassium: 3.4 mmol/L — ABNORMAL LOW (ref 3.5–5.1)
Sodium: 131 mmol/L — ABNORMAL LOW (ref 135–145)
Total Protein: 5.5 g/dL — ABNORMAL LOW (ref 6.5–8.1)

## 2017-02-10 LAB — CBC
HCT: 33.5 % — ABNORMAL LOW (ref 39.0–52.0)
Hemoglobin: 11.1 g/dL — ABNORMAL LOW (ref 13.0–17.0)
MCH: 29.1 pg (ref 26.0–34.0)
MCHC: 33.1 g/dL (ref 30.0–36.0)
MCV: 87.9 fL (ref 78.0–100.0)
PLATELETS: 336 10*3/uL (ref 150–400)
RBC: 3.81 MIL/uL — ABNORMAL LOW (ref 4.22–5.81)
RDW: 14.6 % (ref 11.5–15.5)
WBC: 8.7 10*3/uL (ref 4.0–10.5)

## 2017-02-10 MED ORDER — LORAZEPAM 0.5 MG PO TABS
0.5000 mg | ORAL_TABLET | Freq: Once | ORAL | Status: AC
  Administered 2017-02-10: 0.5 mg via ORAL
  Filled 2017-02-10: qty 1

## 2017-02-10 MED ORDER — POTASSIUM CHLORIDE CRYS ER 20 MEQ PO TBCR
40.0000 meq | EXTENDED_RELEASE_TABLET | Freq: Once | ORAL | Status: AC
  Administered 2017-02-10: 40 meq via ORAL
  Filled 2017-02-10: qty 2

## 2017-02-10 MED ORDER — MAGNESIUM SULFATE 2 GM/50ML IV SOLN
2.0000 g | Freq: Once | INTRAVENOUS | Status: AC
  Administered 2017-02-10: 2 g via INTRAVENOUS
  Filled 2017-02-10: qty 50

## 2017-02-10 NOTE — Evaluation (Signed)
Physical Therapy Evaluation Patient Details Name: Blake Little MRN: [id number] DOB: Sep 25, 1945 Today's Date: 02/10/2017   History of Present Illness  Little is a 71 yo male admitted with altered mental status, difficulty speaking and fall, dx with acute metabolic encephalopathy. During hospital stay Little experienced seizures that have since resolved. Prior medical history significant of hypertension, GERD, depression, anxiety, peptic ulcer disease with bleeding, PTSD, tobacco abuse, alcohol abuse in remission since 2009  Clinical Impression  Little admitted with above diagnosis. Little currently with functional limitations due to the deficits listed below (see Little Problem List). Little is supervision for transfers and min guard for ambulation of 80 feet with RW. Little educated on need to use a RW for safety until he is feeling stronger. Little limited by weakness and nausea. Little will benefit from skilled Little to increase their independence and safety with mobility to allow discharge to the venue listed below.       Follow Up Recommendations No Little follow up;Supervision - Intermittent    Equipment Recommendations  Rolling walker with 5" wheels       Precautions / Restrictions Precautions Precautions: Fall Restrictions Weight Bearing Restrictions: No      Mobility  Bed Mobility               General bed mobility comments: in recliner at entry  Transfers Overall transfer level: Needs assistance Equipment used: Rolling walker (2 wheeled) Transfers: Sit to/from Stand Sit to Stand: Supervision         General transfer comment: supervision for safety, good power up and steadying with RW  Ambulation/Gait Ambulation/Gait assistance: Min guard Ambulation Distance (Feet): 80 Feet Assistive device: Rolling walker (2 wheeled) Gait Pattern/deviations: Step-through pattern;Decreased step length - right;Decreased step length - left Gait velocity: slowed Gait velocity interpretation: Below normal speed for  age/gender General Gait Details: min guard for safety, slow, steady cadence, no LoB       Balance Overall balance assessment: Needs assistance Sitting-balance support: Feet supported;No upper extremity supported Sitting balance-Leahy Scale: Good     Standing balance support: No upper extremity supported Standing balance-Leahy Scale: Fair Standing balance comment: able to steady self in standing before reaching for RW                             Pertinent Vitals/Pain Pain Assessment: No/denies pain    Home Living Family/patient expects to be discharged to:: Private residence Living Arrangements: Children Available Help at Discharge: Family;Available PRN/intermittently Type of Home: House Home Access: Stairs to enter Entrance Stairs-Rails: Lawyer of Steps: 3 Home Layout: One level        Prior Function Level of Independence: Independent         Comments: community Engineer, maintenance        Extremity/Trunk Assessment   Upper Extremity Assessment Upper Extremity Assessment: Overall WFL for tasks assessed;Generalized weakness    Lower Extremity Assessment Lower Extremity Assessment: Overall WFL for tasks assessed;Generalized weakness    Cervical / Trunk Assessment Cervical / Trunk Assessment: Normal  Communication   Communication: No difficulties  Cognition Arousal/Alertness: Awake/alert Behavior During Therapy: WFL for tasks assessed/performed Overall Cognitive Status: No family/caregiver present to determine baseline cognitive functioning  General Comments General comments (skin integrity, edema, etc.): at rest, SaO2 on RA 98%O2, BP 163/69, HR 64 bpm, Little request to return to room after 40 feet ambulation secondary to nausea, after ambulation SaO2 on RA 100% O2, BP 167/70, HR 72 bpm        Assessment/Plan    Little Assessment Patient needs continued  Little services  Little Problem List Decreased activity tolerance;Decreased knowledge of use of DME       Little Treatment Interventions DME instruction;Gait training;Functional mobility training;Therapeutic activities;Therapeutic exercise;Patient/family education    Little Goals (Current goals can be found in the Care Plan section)  Acute Rehab Little Goals Patient Stated Goal: feel better Little Goal Formulation: With patient Time For Goal Achievement: 02/17/17 Potential to Achieve Goals: Good    Frequency Min 3X/week    AM-PAC Little "6 Clicks" Daily Activity  Outcome Measure Difficulty turning over in bed (including adjusting bedclothes, sheets and blankets)?: A Little Difficulty moving from lying on back to sitting on the side of the bed? : A Little Difficulty sitting down on and standing up from a chair with arms (e.g., wheelchair, bedside commode, etc,.)?: A Little Help needed moving to and from a bed to chair (including a wheelchair)?: None Help needed walking in hospital room?: None Help needed climbing 3-5 steps with a railing? : A Little 6 Click Score: 20    End of Session Equipment Utilized During Treatment: Gait belt Activity Tolerance: Patient tolerated treatment well;Other (comment) (ambulation limited by nausea) Patient left: in chair;with call bell/phone within reach Nurse Communication: Mobility status;Other (comment) (nausea) Little Visit Diagnosis: Muscle weakness (generalized) (M62.81)    Time: [id number] Little Time Calculation (min) (ACUTE ONLY): 34 min   Charges:   Little Evaluation $Little Eval Moderate Complexity: 1 Procedure Little Treatments $Gait Training: 8-22 mins   Little G Codes:        Blake Little B. Beverely RisenVan Little Little, DPT Acute Rehabilitation  [phone] Pager [phone]    Blake Little 02/10/2017, 1:49 PM

## 2017-02-10 NOTE — Progress Notes (Signed)
[facility] TEAM 1 - Stepdown/ICU TEAM  PAPA PIERCEFIELD  WUJ:[id number] DOB: [birth year] DOA: 02/06/2017 PCP: [name], NP    Brief Narrative:  71 y.o.M Hx Anxiety, HTN, Depression, PTSD, PUD with bleeding, Tobacco abuse, and EtOH abuse in remission since 2009 who presented with altered mental status, difficult speaking,and a fall.  Per his daugther hisdoctor abruptly stopped his Xanax and Ambien 2 weeks prior, though the pt was still taking approximately half dose of Xanax. The patient became confused and agitated, and suffered one episode of difficulty speaking which lasted for about 2 seconds.   In the ED pt was found to have a sodium 129, alcohol <5, UDS positive for THC and benzos, negative CT head for acute intracranial abnormalities.  During his time in the ED he suffered several occurrences of seizure-like activity, including a generalized tonic-clonic seizure while in the MRI scanner.  Subjective: Patient is resting comfortably in a bedside chair.  He is alert and conversant.  He appears to be oriented that he is somewhat slow to answer questions.  He has not yet worked with physical therapy or occupational therapy.  He denies chest pain shortness breath fevers or chills.  He denies abdominal pain but reports very poor appetite.  Assessment & Plan:  Status epilepticus Neurology has evaluated the patient and has transitioned his antiepileptic drugs from IV form to oral form - he has been switched from short course Dilantin to Keppra which he is to continue after his discharge  Acute metabolic encephalopathy Felt to be due to seizures related to abrupt decrease in benzo dosing - mental status appears to be steadily improving and approaching baseline  Anxiety/depression Consider trial of SSRI as outpatient in follow-up - will not initiate at this time - symptoms appear to be well controlled presently  Esophageal thickening - GERD Incidentally noted on cervical spine MRI  during this hospital stay - will need outpatient GI evaluation - presently asymptomatic  Hyperlipidemia Continue home medical therapy  Hyponatremia Appears to be slowly improving - follow  Hypomagnesemia Remains borderline low - supplement today and recheck in a.m.  Mild hypokalemia Replace once magnesium corrected - likely due to poor oral intake  HTN Overall blood pressure reasonably controlled - follow without change today  Tobacco abuse  DVT prophylaxis: Lovenox Code Status: FULL CODE Family Communication: no family present at time of exam  Disposition Plan: Discharge disposition as per PT/OT - anticipate discharge 02/11/17  Consultants:  Neurology  Antimicrobials:  None  Objective: Blood pressure 136/68, pulse 62, temperature 98.8 F (37.1 C), temperature source Oral, resp. rate 17, height 5\' 8"  (1.727 m), weight 52.5 kg (115 lb 11.9 oz), SpO2 94 %.  Intake/Output Summary (Last 24 hours) at 02/10/17 1034 Last data filed at 02/10/17 0937  Gross per 24 hour  Intake              625 ml  Output             1975 ml  Net            -1350 ml   Filed Weights   02/06/17 1534 02/06/17 2352  Weight: 51.7 kg (114 lb) 52.5 kg (115 lb 11.9 oz)    Examination: General: No acute respiratory distress Lungs: Clear to auscultation bilaterally without wheezes or crackles Cardiovascular: Regular rate and rhythm without murmur gallop or rub normal S1 and S2 Abdomen: Nontender, nondistended, soft, bowel sounds positive, no rebound, no ascites, no appreciable mass Extremities: No significant  cyanosis, clubbing, or edema bilateral lower extremities  CBC:  Recent Labs Lab 02/06/17 1647 02/07/17 0612 02/08/17 0210 02/09/17 0314 02/10/17 0251  WBC 7.0 9.4 11.6* 12.4* 8.7  NEUTROABS 5.3  --   --   --   --   HGB 11.9* 12.2* 11.3* 10.7* 11.1*  HCT 35.7* 36.5* 34.7* 33.1* 33.5*  MCV 89.3 89.7 90.1 89.5 87.9  PLT 323 349 295 334 336   Basic Metabolic Panel:  Recent Labs Lab  02/07/17 1242 02/07/17 1648 02/08/17 0210 02/09/17 0314 02/10/17 0251  NA 129* 129* 132* 130* 131*  K 4.4 4.2 4.5 3.8 3.4*  CL 99* 97* 102 100* 100*  CO2 20* 19* 21* 21* 24  GLUCOSE 98 88 71 54* 78  BUN 8 8 6 9 8   CREATININE 0.81 0.80 0.88 1.03 0.90  CALCIUM 9.6 9.4 8.9 8.6* 8.4*  MG  --   --  1.8 1.6* 1.7   GFR: Estimated Creatinine Clearance: 57.5 mL/min (by C-G formula based on SCr of 0.9 mg/dL).  Liver Function Tests:  Recent Labs Lab 02/06/17 1647 02/08/17 0210 02/09/17 0314 02/10/17 0251  AST 31 29 21 17   ALT 17 16* 14* 14*  ALKPHOS 75 65 67 68  BILITOT 0.5 0.7 1.0 0.6  PROT 6.6 6.0* 5.7* 5.5*  ALBUMIN 3.5 3.2* 2.7* 2.7*    Recent Labs Lab 02/06/17 1647  AMMONIA 16    Cardiac Enzymes:  Recent Labs Lab 02/06/17 1647 02/06/17 2354 02/07/17 0612 02/07/17 1242  CKTOTAL 427*  --   --   --   TROPONINI  --  <0.03 <0.03 <0.03    HbA1C: Hgb A1c MFr Bld  Date/Time Value Ref Range Status  02/07/2017 06:12 AM 5.8 (H) 4.8 - 5.6 % Final    Comment:    (NOTE)         Pre-diabetes: 5.7 - 6.4         Diabetes: >6.4         Glycemic control for adults with diabetes: <7.0   12/07/2014 10:32 AM 6.0 4.6 - 6.5 % Final    Comment:    Glycemic Control Guidelines for People with Diabetes:Non Diabetic:  <6%Goal of Therapy: <7%Additional Action Suggested:  >8%     CBG:  Recent Labs Lab 02/06/17 1654 02/06/17 2138  GLUCAP 70 209*    Recent Results (from the past 240 hour(s))  MRSA PCR Screening     Status: Abnormal   Collection Time: 02/07/17 12:01 AM  Result Value Ref Range Status   MRSA by PCR INVALID RESULTS, SPECIMEN SENT FOR CULTURE (A) NEGATIVE Final    Comment: CALLED TO J.TERRY,RN AT 0345 BY L.PITT 02/08/17        The GeneXpert MRSA Assay (FDA approved for NASAL specimens only), is one component of a comprehensive MRSA colonization surveillance program. It is not intended to diagnose MRSA infection nor to guide or monitor treatment for MRSA  infections.   MRSA culture     Status: None   Collection Time: 02/07/17 12:01 AM  Result Value Ref Range Status   Specimen Description NASAL SWAB  Final   Special Requests NONE  Final   Culture NO MRSA DETECTED  Final   Report Status 02/08/2017 FINAL  Final     Scheduled Meds: .  stroke: mapping our early stages of recovery book   Does not apply Once  . enoxaparin (LOVENOX) injection  40 mg Subcutaneous Q24H  . folic acid  1 mg Intravenous Daily  Or  . folic acid  1 mg Oral Daily  . levETIRAcetam  500 mg Oral BID  . mouth rinse  15 mL Mouth Rinse BID  . nicotine  21 mg Transdermal Daily  . pantoprazole  40 mg Oral Daily  . sodium chloride flush  3 mL Intravenous Q12H  . thiamine injection  100 mg Intravenous Daily   Or  . thiamine  100 mg Oral Daily     LOS: 3 days   Lonia BloodJeffrey T. McClung, MD [facility] Office  [phone] Pager - Text Page per Loretha StaplerAmion as per below:  On-Call/Text Page:      [email or web address]      password [email or web address]  If 7PM-7AM, please contact night-coverage [email or web address] Password [email or web address] 02/10/2017, 10:34 AM

## 2017-02-10 NOTE — Evaluation (Signed)
Occupational Therapy Evaluation Patient Details Name: Blake Little MRN: [id number] DOB: 05-10-1946 Today's Date: 02/10/2017    History of Present Illness Pt is a 71 yo male admitted with altered mental status, difficulty speaking and fall, dx with acute metabolic encephalopathy. During hospital stay pt experienced seizures that have since resolved. Prior medical history significant of hypertension, GERD, depression, anxiety, peptic ulcer disease with bleeding, PTSD, tobacco abuse, alcohol abuse in remission since 2009   Clinical Impression   Patient evaluated by Occupational Therapy with no further acute OT needs identified. All education has been completed and the patient has no further questions. See below for any follow-up Occupational Therapy or equipment needs. OT to sign off. Thank you for referral.      Follow Up Recommendations  No OT follow up    Equipment Recommendations  None recommended by OT    Recommendations for Other Services       Precautions / Restrictions Precautions Precautions: Fall Restrictions Weight Bearing Restrictions: No      Mobility Bed Mobility               General bed mobility comments: in recliner at entry  Transfers Overall transfer level: Needs assistance Equipment used: Rolling walker (2 wheeled) Transfers: Sit to/from Stand Sit to Stand: Supervision         General transfer comment: supervision for safety, good power up and steadying with RW    Balance Overall balance assessment: Needs assistance Sitting-balance support: Feet supported;No upper extremity supported Sitting balance-Leahy Scale: Good     Standing balance support: No upper extremity supported Standing balance-Leahy Scale: Fair Standing balance comment: able to steady self in standing before reaching for RW                           ADL either performed or assessed with clinical judgement   ADL Overall ADL's : Needs  assistance/impaired Eating/Feeding: Independent   Grooming: Wash/dry hands   Upper Body Bathing: Independent   Lower Body Bathing: Independent           Toilet Transfer: Independent       Tub/ Shower Transfer: Therapist, sports Details (indicate cue type and reason): pt requires supervision for tub transfer simulated with trash can this session and safety. Educated on use of a robe or a towell with a clip to close to allow daughter to help . Pt also educated on use of command hook in bathroom to help with hanging towel closer to prevent fall  Functional mobility during ADLs: Supervision/safety;Rolling walker General ADL Comments: pt demonstrates adequate level to have daughter (A) supervision      Vision         Perception     Praxis      Pertinent Vitals/Pain Pain Assessment: No/denies pain     Hand Dominance Right   Extremity/Trunk Assessment Upper Extremity Assessment Upper Extremity Assessment: Overall WFL for tasks assessed   Lower Extremity Assessment Lower Extremity Assessment: Overall WFL for tasks assessed   Cervical / Trunk Assessment Cervical / Trunk Assessment: Normal   Communication Communication Communication: No difficulties   Cognition Arousal/Alertness: Awake/alert Behavior During Therapy: WFL for tasks assessed/performed Overall Cognitive Status: No family/caregiver present to determine baseline cognitive functioning                                     General  Comments  stable vitals during session    Exercises     Shoulder Instructions      Home Living Family/patient expects to be discharged to:: Private residence Living Arrangements: Children Available Help at Discharge: Family;Available PRN/intermittently Type of Home: House Home Access: Stairs to enter Entergy CorporationEntrance Stairs-Number of Steps: 3 Entrance Stairs-Rails: Left;Right Home Layout: One level     Bathroom Shower/Tub: Scientist, research (life sciences)Tub/shower unit    Bathroom Toilet: Standard Bathroom Accessibility: Yes       Additional Comments: pt lives with ex wife for 2 years and feels now is the time to leave the home. Pt plans to have full assistance from daughter upon d/c as needed. Daughter does work with x2 young children. pt mentioned two adult sons in the area and [place]      Prior Functioning/Environment Level of Independence: Independent        Comments: community ambulator         OT Problem List:        OT Treatment/Interventions:      OT Goals(Current goals can be found in the care plan section) Acute Rehab OT Goals Patient Stated Goal: feel better OT Goal Formulation: With patient  OT Frequency:     Barriers to D/C:            Co-evaluation              AM-PAC PT "6 Clicks" Daily Activity     Outcome Measure Help from another person eating meals?: None Help from another person taking care of personal grooming?: None Help from another person toileting, which includes using toliet, bedpan, or urinal?: None Help from another person bathing (including washing, rinsing, drying)?: None Help from another person to put on and taking off regular upper body clothing?: None Help from another person to put on and taking off regular lower body clothing?: None 6 Click Score: 24   End of Session Equipment Utilized During Treatment: Gait belt;Rolling walker Nurse Communication: Mobility status;Precautions  Activity Tolerance: Patient tolerated treatment well Patient left: in chair;with call bell/phone within reach  OT Visit Diagnosis: Unsteadiness on feet (R26.81)                Time: [id number] OT Time Calculation (min): 17 min Charges:  OT General Charges $OT Visit: 1 Procedure OT Evaluation $OT Eval Moderate Complexity: 1 Procedure G-Codes:      Mateo FlowJones, Blake   OTR/L Pager: [phone]: [phone] Office: [phone] .   Boone MasterJones, Blake Mehrer B 02/10/2017, 2:47 PM

## 2017-02-11 DIAGNOSIS — K210 Gastro-esophageal reflux disease with esophagitis: Secondary | ICD-10-CM

## 2017-02-11 DIAGNOSIS — Z720 Tobacco use: Secondary | ICD-10-CM | POA: Diagnosis not present

## 2017-02-11 DIAGNOSIS — G9340 Encephalopathy, unspecified: Secondary | ICD-10-CM

## 2017-02-11 DIAGNOSIS — F1010 Alcohol abuse, uncomplicated: Secondary | ICD-10-CM

## 2017-02-11 DIAGNOSIS — G40909 Epilepsy, unspecified, not intractable, without status epilepticus: Secondary | ICD-10-CM

## 2017-02-11 DIAGNOSIS — E871 Hypo-osmolality and hyponatremia: Secondary | ICD-10-CM | POA: Diagnosis not present

## 2017-02-11 DIAGNOSIS — K2289 Other specified disease of esophagus: Secondary | ICD-10-CM

## 2017-02-11 DIAGNOSIS — I10 Essential (primary) hypertension: Secondary | ICD-10-CM | POA: Diagnosis not present

## 2017-02-11 DIAGNOSIS — K228 Other specified diseases of esophagus: Secondary | ICD-10-CM

## 2017-02-11 DIAGNOSIS — F329 Major depressive disorder, single episode, unspecified: Secondary | ICD-10-CM | POA: Diagnosis not present

## 2017-02-11 DIAGNOSIS — G9341 Metabolic encephalopathy: Secondary | ICD-10-CM | POA: Diagnosis not present

## 2017-02-11 DIAGNOSIS — F419 Anxiety disorder, unspecified: Secondary | ICD-10-CM | POA: Diagnosis not present

## 2017-02-11 LAB — BASIC METABOLIC PANEL
ANION GAP: 8 (ref 5–15)
BUN: 8 mg/dL (ref 6–20)
CHLORIDE: 98 mmol/L — AB (ref 101–111)
CO2: 27 mmol/L (ref 22–32)
CREATININE: 0.8 mg/dL (ref 0.61–1.24)
Calcium: 9 mg/dL (ref 8.9–10.3)
GFR calc non Af Amer: >60 mL/min (ref >60)
Glucose, Bld: 96 mg/dL (ref 65–99)
Potassium: 3.7 mmol/L (ref 3.5–5.1)
Sodium: 133 mmol/L — ABNORMAL LOW (ref 135–145)

## 2017-02-11 LAB — MAGNESIUM: MAGNESIUM: 1.9 mg/dL (ref 1.7–2.4)

## 2017-02-11 MED ORDER — FOLIC ACID 1 MG PO TABS: 1.0000 mg | ORAL_TABLET | Freq: Every day | ORAL | 0 refills | Status: AC

## 2017-02-11 MED ORDER — PANTOPRAZOLE SODIUM 40 MG PO TBEC: 40.0000 mg | DELAYED_RELEASE_TABLET | Freq: Every day | ORAL | 0 refills | Status: AC

## 2017-02-11 MED ORDER — ZOLPIDEM TARTRATE 10 MG PO TABS: 5.0000 mg | ORAL_TABLET | Freq: Every evening | ORAL | 0 refills | Status: DC | PRN

## 2017-02-11 MED ORDER — LEVETIRACETAM 500 MG PO TABS: 500.0000 mg | ORAL_TABLET | Freq: Two times a day (BID) | ORAL | 0 refills | Status: DC

## 2017-02-11 MED ORDER — NICOTINE 21 MG/24HR TD PT24: 21.0000 mg | MEDICATED_PATCH | Freq: Every day | TRANSDERMAL | 0 refills | Status: DC

## 2017-02-11 NOTE — Discharge Summary (Signed)
Physician Discharge Summary  Blake Little WUJ:[id number] DOB: [birth year] DOA: 02/06/2017  PCP: Blake Craze, NP  Admit date: 02/06/2017 Discharge date: 02/11/2017  Time spent: 35 minutes  Recommendations for Outpatient Follow-up:   Acute metabolic encephalopathy: -Etiology is not clear.  -Per Blake Little and Blake Little were abruptly discontinued by his doctor, but pt is still taking approximately half dose of Little, and his UDS is positive for benzodiazepine. -6/29 Hemoglobin A1c = 5.8 check A1c -B 12/TSH WNL -Continue thiamine supplementation -Schedule a establish care with [facility] 1-2 week acute encephalopathy, seizures. Start  Prozac or Paxil for his anxiety/depression?  Seizures Disorder -Per Neurology workup patient with seizures have resolved, now diffuse encephalopathy see EEG results below -Continue Dilantin per neurology  Anxiety/Depression -At length with family and patient concerning treatment for anxiety. Little SHOULD never be used long-term for anxiety. -Neurology to determine if/when patient may start SSRI Prozac or Paxil?  Esophagus Thickening  -T-spine MRI shows thickening of esophagus.  -Schedule a establish care in 2-3 weeks with [facility] evaluate esophageal thickening. EGD vs barium esophagram?    HLD  Fall due to seizure: -CT-head negative.  Hyponatremia: -Likely multifactorial etiology, including poor oral intake, dehydration and Prinzide use, or thyroid dysfunction -Schedule follow-up appointment with NP Blake Little in 1-2 weeks hyponatremic, hypomagnesemia, hypertension, tobacco abuse, new onset seizure  Hypertension: -hold lisinopril due to hyponatremia -Currently stable without medication continue monitor  GERD: -Protonix  Tobacco abuse  -Did counseling about importance of quitting smoking -Nicotine patch  EtOH abuse -pt stopped drinking alcohol 2009 -CIWA protocol        Discharge Diagnoses:   Principal Problem:   Acute metabolic encephalopathy Active Problems:   Anxiety and depression   Hypertension   GERD (gastroesophageal reflux disease)   Tobacco abuse   History of alcohol abuse   Fall   Hyponatremia   Discharge Condition: Stable  Diet recommendation: Regular  Filed Weights   02/06/17 1534 02/06/17 2352  Weight: 114 lb (51.7 kg) 115 lb 11.9 oz (52.5 kg)    History of present illness:  71 y.o.WM PMHx Anxiety, HTN, Depression,PTSD, PUD with bleeding, Tobacco abuse, EtOH abuse in remission since 2009, who presents with altered mental status, difficult speakingand fall.  Per his daugther, pt was on Xanaxa and Blake Little, hisdoctor abruptly stopped his Little and Blake Little 2 weeks ago, but pt still takes approximately half dose of Little currently. Blake states that the patient became confused in the past 2 days and more agitated. Pt had fall and hadright eye bruise, buthis Blake is not sure when it happened. Pt thinks that he is on a blood thinner that when asked which blood thinner he takes, he states "bh+." He is unable to provide more history. Blake states that the patient had one episode of difficult speaking which lasted for about 2 seconds. Patient does not have unilateral weakness, numbness or tingliness in extremities, no facial droop, slurred speech, vision change or hearing loss. Patient does not have chest pain, SOB, cough, fever or chills. No nausea, vomiting, diarrhea, abdominal pain, symptoms of UTI.  During his hospital physician patient was treated for seizures most likely precipitated by abrupt discontinuation of his Little+ Blake Little. In addition patient was treated for possible EtOH withdrawal. Patient and family have been counseled he will remain on seizure medications for minimal 6 months will need to follow-up with outside neurologist who will clear him for operating motor vehicles. In addition they have been counseled that patient should NEVER used  Little  again. Patient will also need follow-up with GI physician to determine if/when EGD or barium esophagram should be performed to evaluate his esophageal thickening.   Procedures: 6/28 CT head Wo contrast: Negative acute abnormality 6/29 EEG: This awake and drowsyEEG is abnormal. Multiple clinicoelectrographic seizures consistent with status epilepticus. Initially seizures arise from the left posterior quadrant, then as the study progresses, epileptiform discharges are seen in a generalized fashion. He is seen to have several episodes of forced head turn to the right and was confused throughout the study.  6/29 overnight EEG: indicative of a mild diffuse encephalopathy, non-specific as to etiology 6/30 Thoracic MRI:-Normal alignment and no acute bony findings or worrisome bone lesions. Negative cord lesions - Small disc protrusions and osteophytic spurring at T4-5, T8-9 andT10-11 but no neural compression. -Moderate wall thickening of the distal esophagus and enhancement. This could be esophagitis but could not exclude infiltrating tumor. Radiation changes are also possible. Recommend correlation with endoscopy or barium esophagram.    Consultations: Neurology    Antibiotics None   Discharge Exam: Vitals:   02/10/17 1927 02/10/17 2247 02/11/17 0330 02/11/17 0700  BP: 131/79 (!) 156/64 (!) 159/81 119/63  Pulse: 72 68 68 67  Resp: 18 (!) 21 18 12   Temp: 98.9 F (37.2 C) 99.8 F (37.7 C) 99.3 F (37.4 C) 98.8 F (37.1 C)  TempSrc: Oral Oral Oral Oral  SpO2: 99% 100% 95% 96%  Weight:      Height:        General: A/O 4, No acute respiratory distress, cachectic Eyes: negative scleral hemorrhage, negative anisocoria, negative icterus ENT: Negative Runny nose, negative gingival bleeding, Neck:  Negative scars, masses, torticollis, lymphadenopathy, JVD Lungs: Clear to auscultation bilaterally without wheezes or crackles Cardiovascular: Regular rate and rhythm without murmur  gallop or rub normal S1 and S2     Discharge Instructions   Allergies as of 02/11/2017      Reactions   Asa [aspirin]    Abdominal pain      Medication List    STOP taking these medications   ALPRAZolam 1 MG tablet Commonly known as:  Little   buPROPion 150 MG 12 hr tablet Commonly known as:  WELLBUTRIN SR   FLUoxetine 40 MG capsule Commonly known as:  PROZAC   lisinopril 10 MG tablet Commonly known as:  PRINIVIL,ZESTRIL   venlafaxine XR 150 MG 24 hr capsule Commonly known as:  EFFEXOR-XR     TAKE these medications   folic acid 1 MG tablet Commonly known as:  FOLVITE Take 1 tablet (1 mg total) by mouth daily. Start taking on:  02/12/2017   levETIRAcetam 500 MG tablet Commonly known as:  KEPPRA Take 1 tablet (500 mg total) by mouth 2 (two) times daily.   meloxicam 7.5 MG tablet Commonly known as:  MOBIC Take 1 tablet (7.5 mg total) by mouth daily as needed for pain.   nicotine 21 mg/24hr patch Commonly known as:  NICODERM CQ - dosed in mg/24 hours Place 1 patch (21 mg total) onto the skin daily. Start taking on:  02/12/2017   pantoprazole 40 MG tablet Commonly known as:  PROTONIX Take 1 tablet (40 mg total) by mouth daily. Start taking on:  02/12/2017   zolpidem 10 MG tablet Commonly known as:  Blake Little Take 0.5 tablets (5 mg total) by mouth at bedtime as needed for sleep. What changed:  how much to take      Allergies  Allergen Reactions  . Asa [Aspirin]  Abdominal pain   Follow-up Information    [facility]. Schedule an appointment as soon as possible for a visit in 2 week(s).   Specialty:  Neurology Why:  [facility]2 week acute encephalopathy, seizures. Start  Prozac or Paxil for his anxiety/depression?  Contact information: [address] [place] [id number] [phone]       [facility] Follow up in 3 week(s).   Specialty:   Gastroenterology Why:  Schedule a establish care in 2-3 weeks with [facility] evaluate esophageal thickening. EGD vs barium esophagram?   Contact information: [address] [place] [phone]       Blake Little'Sullivan, Melissa, NP. Schedule an appointment as soon as possible for a visit in 3 week(s).   Specialty:  Internal Medicine Why:  Schedule follow-up appointment with NP Blake CrazeMelissa O'Sullivan in 1-2 weeks hyponatremic, hypomagnesemia, hypertension, tobacco abuse, new onset seizure Contact information: [address] [place] [id number] [phone]            The results of significant diagnostics from this hospitalization (including imaging, microbiology, ancillary and laboratory) are listed below for reference.    Significant Diagnostic Studies: Ct Head Wo Contrast  Result Date: 02/06/2017 CLINICAL DATA:  71 year old male status post fall yesterday with subsequent confusion. Right supraorbital hematoma. On blood thinners. EXAM: CT HEAD WITHOUT CONTRAST TECHNIQUE: Contiguous axial images were obtained from the base of the skull through the vertex without intravenous contrast. COMPARISON:  None. FINDINGS: Brain: No midline shift, ventriculomegaly, mass effect, evidence of mass lesion, intracranial hemorrhage or evidence of cortically based acute infarction. No cortical encephalomalacia. Normal for age gray-white matter differentiation. Vascular: Calcified atherosclerosis at the skull base. No suspicious intracranial vascular hyperdensity. Skull: No fracture identified. No acute osseous abnormality identified. Sinuses/Orbits: Visualized paranasal sinuses and mastoids are well pneumatized. Other: No discrete scalp hematoma identified. Visible orbit and face soft tissues appear within normal limits. IMPRESSION: No acute intracranial abnormality or acute traumatic injury identified. Normal for age noncontrast CT appearance of the brain.  Electronically Signed   By: Odessa FlemingH  Hall M.D.   On: 02/06/2017 17:19   Mr Thoracic Spine W Wo Contrast  Result Date: 02/08/2017 CLINICAL DATA:  Myelopathic symptoms. EXAM: MRI THORACIC WITHOUT AND WITH CONTRAST TECHNIQUE: Multiplanar and multiecho pulse sequences of the thoracic spine were obtained without and with intravenous contrast. CONTRAST:  11mL MULTIHANCE GADOBENATE DIMEGLUMINE 529 MG/ML IV SOLN COMPARISON:  CT scan 12/08/2014 FINDINGS: MRI THORACIC SPINE FINDINGS Alignment:  Normal Vertebrae: Normal marrow signal. No bone lesions or fracture. No worrisome marrow process. No areas of abnormal contrast enhancement. Cord: Normal MR appearance of the thoracic spinal cord. No cord lesions, syrinx or abnormal cord enhancement. Paraspinal and other soft tissues: The upper esophagus is somewhat patulous. The mid distal esophagus demonstrates moderate wall thickening and enhancement particularly at the T11-12 level just above the GE junction. This could be due to reflux esophagitis but could not exclude infiltrating tumor. Recommend correlation with endoscopy or barium esophagram. No paraesophageal lymphadenopathy. The aorta is normal in caliber. Evidence of prior surgery at the GE junction based on the prior CT scan. Disc levels: The spinal canal is generous in is no significant spinal or foraminal stenosis identified. Tiny disc protrusions and spurring changes noted at T4-5, T8-9 and T10-11. IMPRESSION: 1. Normal alignment and no acute bony findings or worrisome bone lesions. 2. Normal MR appearance of the thoracic spinal cord. No  cord lesions or abnormal cord enhancement. 3. Small disc protrusions and osteophytic spurring at T4-5, T8-9 and T10-11 but no neural compression. 4. Moderate wall thickening of the distal esophagus and enhancement. This could be esophagitis but could not exclude infiltrating tumor. Radiation changes are also possible. Recommend correlation with endoscopy or barium esophagram.  Electronically Signed   By: Rudie Meyer M.D.   On: 02/08/2017 16:51    Microbiology: Recent Results (from the past 240 hour(s))  MRSA PCR Screening     Status: Abnormal   Collection Time: 02/07/17 12:01 AM  Result Value Ref Range Status   MRSA by PCR INVALID RESULTS, SPECIMEN SENT FOR CULTURE (A) NEGATIVE Final    Comment: CALLED TO J.TERRY,RN AT 0345 BY L.PITT 02/08/17        The GeneXpert MRSA Assay (FDA approved for NASAL specimens only), is one component of a comprehensive MRSA colonization surveillance program. It is not intended to diagnose MRSA infection nor to guide or monitor treatment for MRSA infections.   MRSA culture     Status: None   Collection Time: 02/07/17 12:01 AM  Result Value Ref Range Status   Specimen Description NASAL SWAB  Final   Special Requests NONE  Final   Culture NO MRSA DETECTED  Final   Report Status 02/08/2017 FINAL  Final     Labs: Basic Metabolic Panel:  Recent Labs Lab 02/07/17 1648 02/08/17 0210 02/09/17 0314 02/10/17 0251 02/11/17 0247  NA 129* 132* 130* 131* 133*  K 4.2 4.5 3.8 3.4* 3.7  CL 97* 102 100* 100* 98*  CO2 19* 21* 21* 24 27  GLUCOSE 88 71 54* 78 96  BUN 8 6 9 8 8   CREATININE 0.80 0.88 1.03 0.90 0.80  CALCIUM 9.4 8.9 8.6* 8.4* 9.0  MG  --  1.8 1.6* 1.7 1.9   Liver Function Tests:  Recent Labs Lab 02/06/17 1647 02/08/17 0210 02/09/17 0314 02/10/17 0251  AST 31 29 21 17   ALT 17 16* 14* 14*  ALKPHOS 75 65 67 68  BILITOT 0.5 0.7 1.0 0.6  PROT 6.6 6.0* 5.7* 5.5*  ALBUMIN 3.5 3.2* 2.7* 2.7*   No results for input(s): LIPASE, AMYLASE in the last 168 hours.  Recent Labs Lab 02/06/17 1647  AMMONIA 16   CBC:  Recent Labs Lab 02/06/17 1647 02/07/17 0612 02/08/17 0210 02/09/17 0314 02/10/17 0251  WBC 7.0 9.4 11.6* 12.4* 8.7  NEUTROABS 5.3  --   --   --   --   HGB 11.9* 12.2* 11.3* 10.7* 11.1*  HCT 35.7* 36.5* 34.7* 33.1* 33.5*  MCV 89.3 89.7 90.1 89.5 87.9  PLT 323 349 295 334 336   Cardiac  Enzymes:  Recent Labs Lab 02/06/17 1647 02/06/17 2354 02/07/17 0612 02/07/17 1242  CKTOTAL 427*  --   --   --   TROPONINI  --  <0.03 <0.03 <0.03   BNP: BNP (last 3 results) No results for input(s): BNP in the last 8760 hours.  ProBNP (last 3 results) No results for input(s): PROBNP in the last 8760 hours.  CBG:  Recent Labs Lab 02/06/17 1654 02/06/17 2138  GLUCAP 70 209*       Signed:  Carolyne Littles, MD [facility] [phone] pager

## 2017-02-13 ENCOUNTER — Telehealth: Payer: Self-pay

## 2017-02-13 NOTE — Telephone Encounter (Signed)
02/13/17  Transition Care Management Follow-up Telephone Call  ADMISSION DATE: 02/06/17  DISCHARGE DATE: 02/11/17   How have you been since you were released from the hospital? Has had some weakness. Having memory loss BID taking Keppra for this.  Do you understand why you were in the hospital? Yes.   Do you understand the discharge instrcutions? Yes  Items Reviewed:  Medications reviewed: Yes   Allergies reviewed: Aspirin   Dietary changes reviewed:Regular diet   Referrals reviewed: PCP scheduled   Functional Questionnaire:   Activities of Daily Living (ADLs):  Able to perform all per patient Any patient concerns?  Would like to discuss medications   Confirmed importance and date/time of follow-up visits scheduled: Yes Confirmed with patient if condition begins to worsen call PCP or go to the ER. Yes   Patient was given the office number and encouragred to call back with questions or concerns. Yes

## 2017-02-14 MED FILL — Medication: Qty: 1 | Status: AC

## 2017-02-17 ENCOUNTER — Encounter: Payer: Self-pay | Admitting: Family

## 2017-02-17 ENCOUNTER — Ambulatory Visit (INDEPENDENT_AMBULATORY_CARE_PROVIDER_SITE_OTHER): Payer: MEDICARE | Admitting: Family

## 2017-02-17 VITALS — BP 136/69 | HR 90 | Temp 98.4°F | Resp 18 | Wt 107.2 lb

## 2017-02-17 DIAGNOSIS — G40909 Epilepsy, unspecified, not intractable, without status epilepticus: Secondary | ICD-10-CM

## 2017-02-17 DIAGNOSIS — E871 Hypo-osmolality and hyponatremia: Secondary | ICD-10-CM

## 2017-02-17 DIAGNOSIS — F32A Depression, unspecified: Secondary | ICD-10-CM

## 2017-02-17 DIAGNOSIS — G4700 Insomnia, unspecified: Secondary | ICD-10-CM

## 2017-02-17 DIAGNOSIS — K228 Other specified diseases of esophagus: Secondary | ICD-10-CM

## 2017-02-17 DIAGNOSIS — K2289 Other specified disease of esophagus: Secondary | ICD-10-CM

## 2017-02-17 DIAGNOSIS — F329 Major depressive disorder, single episode, unspecified: Secondary | ICD-10-CM | POA: Diagnosis not present

## 2017-02-17 MED ORDER — ESCITALOPRAM OXALATE 10 MG PO TABS: ORAL_TABLET | ORAL | 0 refills | Status: AC

## 2017-02-17 NOTE — Progress Notes (Signed)
Subjective:    Patient ID: Blake Little, male    DOB: Dec 12, 1945, 71 y.o.   MRN: [id number]  HPI   Mr. Blake Little is a 71 yr old male who presents today for hospital follow up.    Esophageal thickening-This was an incidental finding noted on MRI performed on June 30. He has an upcoming appointment with GI.    Acute Metabolic Encephalopathy- Reports that he had been on xanax and could not get them refilled.  Woke up at his son's house where he was house/dog sitting.  "Ran into something." Had blood on the bed and bruising above his right eye. Reports that he was dog sitting.  Reports that he went ot [employer] He became unresponsive but did not lose consciousness.  EMS was contacted and he was brought to the ED.  The cause of his encephalopathy was unclear.  He had seizures while in the hospital. He had an abnormal EEG.  He had a CT of the head performed during his hospitalization which was unremarkable. He has an appointment with neurology on 7/27. He has not had any obvious seizures or LOC since returning home. He does have some word finding issues at times.    ?Depression- He has been "edgy" per daughter. This is unusual or him. He is off of xanax.  Daughter is worried that he has some depression.  He reports that he is coming up on an anniversary and feels that he may be experiencing some posttraumatic stress. 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 He reports ongoing issues with insomnia. He is requesting a refill on Ambien. He does admit to regular marijuana use.  Review of Systems    see HPI  Past Medical History:  Diagnosis Date  . Anxiety    pt. reported having panic attacks last year  . Depression   . GERD (gastroesophageal reflux disease)   . History of bleeding ulcers 1986   hospitalized  . PTSD (post-traumatic stress disorder)      Social History   Social History  . Marital status: Divorced    Spouse name: N/A    . Number of children: N/A  . Years of education: N/A   Occupational History  . Not on file.   Social History Main Topics  . Smoking status: Current Every Day Smoker    Packs/day: 0.75    Years: 53.00    Types: Cigarettes  . Smokeless tobacco: Not on file  . Alcohol use No  . Drug use: Yes     Comment: marijuana   . Sexual activity: Not on file   Other Topics Concern  . Not on file   Social History Narrative   ** Merged History Encounter **       In Divorce 3 sons- youngest son in [place], other sons live locally 1 daughter- lives locally Brother and sisters live locally (1 of 7 children) Retired Cabin crewtrain driver  Completed HS and technically schools Enjoys golf/outside activities     Past Surgical History:  Procedure Laterality Date  . STOMACH SURGERY  1986   due to ulcers, pt unsure of procedure    Family History  Problem Relation Age of Onset  . COPD Mother   . Heart attack Father   . Arthritis Brother     Allergies  Allergen Reactions  . Asa [Aspirin]     Abdominal pain    Current Outpatient Prescriptions on File Prior to  Visit  Medication Sig Dispense Refill  . folic acid (FOLVITE) 1 MG tablet Take 1 tablet (1 mg total) by mouth daily. 30 tablet 0  . levETIRAcetam (KEPPRA) 500 MG tablet Take 1 tablet (500 mg total) by mouth 2 (two) times daily. 60 tablet 0  . meloxicam (MOBIC) 7.5 MG tablet Take 1 tablet (7.5 mg total) by mouth daily as needed for pain. 30 tablet 5  . nicotine (NICODERM CQ - DOSED IN MG/24 HOURS) 21 mg/24hr patch Place 1 patch (21 mg total) onto the skin daily. 28 patch 0  . pantoprazole (PROTONIX) 40 MG tablet Take 1 tablet (40 mg total) by mouth daily. 30 tablet 0  . zolpidem (AMBIEN) 10 MG tablet Take 0.5 tablets (5 mg total) by mouth at bedtime as needed for sleep. 1 tablet 0   No current facility-administered medications on file prior to visit.     BP 136/69 (BP Location: Right Arm, Cuff Size: Normal)   Pulse 90   Temp 98.4 F  (36.9 C) (Oral)   Resp 18   Wt 107 lb 3.2 oz (48.6 kg)   SpO2 100%   BMI 16.30 kg/m    Objective:   Physical Exam  Constitutional: He is oriented to person, place, and time. He appears well-developed and well-nourished. No distress.  HENT:  Head: Normocephalic and atraumatic.  Eyes: EOM are normal. Pupils are equal, round, and reactive to light.  Cardiovascular: Normal rate and regular rhythm.   No murmur heard. Pulmonary/Chest: Effort normal and breath sounds normal. No respiratory distress. He has no wheezes. He has no rales.  Musculoskeletal: He exhibits no edema.  Neurological: He is alert and oriented to person, place, and time. He exhibits normal muscle tone. Coordination normal.  Skin: Skin is warm and dry.  Psychiatric: His behavior is normal. Judgment and thought content normal.  Mildly agitated.          Assessment & Plan:  Depression-patient scored severely depressed on pH Q9 with a value of 24. He denies suicidal or homicidal ideation. He is advised to call 911 should he develop hurting self or others. Patient is agreeable to plan. We did discuss possibility of starting a medication and he is agreeable to this. Will initiate Lexapro 10 mg. He is advised to start 5 mg once daily for 1 week then increase to full tablet once daily   on weeks. He is advised to follow-up in one month.  Insomnia-advised patient that if we were to continue Ambien that he would need to sign a controlled substance contract and he would need to discontinue marijuana use. He reports that he would prefer to continue marijuana rather than Ambien. Therefore prescription was not provided.  Seizure disorder-he is now on Keppra. He appears to be stable on this dose. He is advised to keep his upcoming appointment with neurology as scheduled. He is reminded of the [place] law but no driving until 6 months seizure-free.  Esophageal thickening-he is advised to keep his upcoming appointment with GI  for further evaluation.  Hyponatremia- Obtain follow-up sodium level. We'll obtain additional follow-up labs as ordered.

## 2017-02-17 NOTE — Patient Instructions (Signed)
Please schedule a lab visit at the front desk. Start lexapro 5mg  once daily (1/2 tab) once daily x 1 week, then increase to a full tab once daily on week two.

## 2017-02-20 ENCOUNTER — Other Ambulatory Visit: Payer: Self-pay | Admitting: Gastroenterology

## 2017-02-20 DIAGNOSIS — R1310 Dysphagia, unspecified: Secondary | ICD-10-CM

## 2017-02-21 ENCOUNTER — Ambulatory Visit
Admission: RE | Admit: 2017-02-21 | Discharge: 2017-02-21 | Disposition: A | Discharge status: Home / Self Care | Payer: BLUE CROSS/BLUE SHIELD | Source: Ambulatory Visit | Attending: Gastroenterology | Admitting: Gastroenterology

## 2017-02-21 DIAGNOSIS — R1310 Dysphagia, unspecified: Secondary | ICD-10-CM

## 2017-03-03 ENCOUNTER — Encounter: Payer: Self-pay | Admitting: Diagnostic Neuroimaging

## 2017-03-03 ENCOUNTER — Ambulatory Visit (INDEPENDENT_AMBULATORY_CARE_PROVIDER_SITE_OTHER): Payer: MEDICARE | Admitting: Diagnostic Neuroimaging

## 2017-03-03 VITALS — BP 153/70 | HR 74 | Ht 68.0 in | Wt 108.2 lb

## 2017-03-03 DIAGNOSIS — F19931 Other psychoactive substance use, unspecified with withdrawal delirium: Secondary | ICD-10-CM

## 2017-03-03 DIAGNOSIS — F13231 Sedative, hypnotic or anxiolytic dependence with withdrawal delirium: Secondary | ICD-10-CM

## 2017-03-03 DIAGNOSIS — F19231 Other psychoactive substance dependence with withdrawal delirium: Secondary | ICD-10-CM

## 2017-03-03 DIAGNOSIS — F1011 Alcohol abuse, in remission: Secondary | ICD-10-CM | POA: Diagnosis not present

## 2017-03-03 DIAGNOSIS — F13931 Sedative, hypnotic or anxiolytic use, unspecified with withdrawal delirium: Secondary | ICD-10-CM

## 2017-03-03 DIAGNOSIS — R569 Unspecified convulsions: Secondary | ICD-10-CM

## 2017-03-03 MED ORDER — LEVETIRACETAM 500 MG PO TABS: 500.0000 mg | ORAL_TABLET | Freq: Two times a day (BID) | ORAL | 4 refills | Status: AC

## 2017-03-03 NOTE — Patient Instructions (Signed)
Thank you for coming to see [place] at [facility]. I hope we have been able to provide you high quality care today.  You may receive a patient satisfaction survey over the next few weeks. We would appreciate your feedback and comments so that we may continue to improve ourselves and the health of our patients.  SEIZURE DISORDER (? Xanax withdrawal vs other secondary cause; last seizure February 09, 2017) - check MRI brain   - continue levetiracetam 543m twice a day  - According to [place] law, you can not drive unless you are seizure / syncope free for at least 6 months and under physician's care.   - Please maintain precautions. Do not participate in activities where a loss of awareness could harm you or someone else. No swimming alone, no tub bathing, no hot tubs, no driving, no operating motorized vehicles (cars, ATVs, motocycles, etc), lawnmowers, power tools or firearms. No standing at heights, such as rooftops, ladders or stairs. Avoid hot objects such as stoves, heaters, open fires. Wear a helmet when riding a bicycle, scooter, skateboard, etc. and avoid areas of traffic. Set your water heater to 120 degrees or less.   MOOD DISTURBANCE / DEPRESSION / ANXIETY - continue lexapro - recommend to establish with psychiatry / psychology    ~~~~~~~~~~~~~~~~~~~~~~~~~~~~~~~~~~~~~~~~~~~~~~~~~~~~~~~~~~~~~~~~~  DR. Mililani Murthy'S GUIDE TO HAPPY AND HEALTHY LIVING These are some of my general health and wellness recommendations. Some of them may apply to you better than others. Please use common sense as you try these suggestions and feel free to ask me any questions.   ACTIVITY/FITNESS Mental, social, emotional and physical stimulation are very important for brain and body health. Try learning a new activity (arts, music, language, sports, games).  Keep moving your body to the best of your abilities. You can do this at home, inside or outside, the park, community center, gym or anywhere you  like. Consider a physical therapist or personal trainer to get started. Consider the app Sworkit. Fitness trackers such as smart-watches, smart-phones or Fitbits can help as well.   NUTRITION Eat more plants: colorful vegetables, nuts, seeds and berries.  Eat less sugar, salt, preservatives and processed foods.  Avoid toxins such as cigarettes and alcohol.  Drink water when you are thirsty. Warm water with a slice of lemon is an excellent morning drink to start the day.  Consider these websites for more information The Nutrition Source (hhttps://www.henry-hernandez.biz/ Precision Nutrition (wWindowBlog.ch   RELAXATION Consider practicing mindfulness meditation or other relaxation techniques such as deep breathing, prayer, yoga, tai chi, massage. See website mindful.org or the apps Headspace or Calm to help get started.   SLEEP Try to get at least 7-8+ hours sleep per day. Regular exercise and reduced caffeine will help you sleep better. Practice good sleep hygeine techniques. See website sleep.org for more information.   PLANNING Prepare estate planning, living will, healthcare POA documents. Sometimes this is best planned with the help of an attorney. Theconversationproject.org and agingwithdignity.org are excellent resources.

## 2017-03-03 NOTE — Progress Notes (Signed)
[facility]  PATIENT: Blake Little DOB: 1945/08/22  REFERRING CLINICIAN: Carolyne Littles  HISTORY FROM: patient and ex-wife REASON FOR VISIT: new consult    HISTORICAL  CHIEF COMPLAINT:  Chief Complaint  Patient presents with  . ER referral  acute encephalopathy    last one 2 wks.   Stress - trigger,  ex wife with pt.    . Seizures    HISTORY OF PRESENT ILLNESS:   71 year old male with alcohol abuse, depression, anxiety, melanoma, here for evaluation of seizure.  Patient has long history of alcohol abuse. Patient had been living in [place] and back and forth between [place] and [place] for last several years. In 2011 patient quit alcohol, after multiple detox and treatment Center admissions. Around 2013 patient had become increasingly agitated, difficult with his family, leading to divorce with his wife. Around this time, patient had started to take Xanax and Ambien, by a physician in [place], for treatment of anxiety and insomnia. Recently when patient moved back to [place] this was discontinued. However patient continued to take low-dose Xanax that he obtained in the black market, 1 tablet per day. Previously was taking 3 tablets per day. On 02/06/17 patient was at a restaurant, confused, and brought to the hospital via ambulance. While in the hospital patient was witnessed to have multiple seizures. Patient had significant confusion and encephalopathy. Patient was treated with antiseizure medication, stabilized, discharged home.  Since that time patient has had no further seizures. He continues on levetiracetam 500 mg twice a day.  Patient continues to have issues of memory loss, confusion, depression, anxiety, short-term memory loss, incoherent language at times, hasty financial decisions, and other concerns expressed by family.    REVIEW OF SYSTEMS: Full 14 system review of systems performed and negative with exception of: memory loss  confusion numbness weakness insomnia depression anxiety weight loss incr thirst SOB trouble swallowing.   ALLERGIES: Allergies  Allergen Reactions  . Asa [Aspirin]     Abdominal pain    HOME MEDICATIONS: Outpatient Medications Prior to Visit  Medication Sig Dispense Refill  . escitalopram (LEXAPRO) 10 MG tablet 1/2 tab by mouth once daily for 1 week, then increase to a full tablet once daily on week two. 30 tablet 0  . folic acid (FOLVITE) 1 MG tablet Take 1 tablet (1 mg total) by mouth daily. 30 tablet 0  . levETIRAcetam (KEPPRA) 500 MG tablet Take 1 tablet (500 mg total) by mouth 2 (two) times daily. 60 tablet 0  . meloxicam (MOBIC) 7.5 MG tablet Take 1 tablet (7.5 mg total) by mouth daily as needed for pain. 30 tablet 5  . pantoprazole (PROTONIX) 40 MG tablet Take 1 tablet (40 mg total) by mouth daily. 30 tablet 0  . nicotine (NICODERM CQ - DOSED IN MG/24 HOURS) 21 mg/24hr patch Place 1 patch (21 mg total) onto the skin daily. (Patient not taking: Reported on 03/03/2017) 28 patch 0   No facility-administered medications prior to visit.     PAST MEDICAL HISTORY: Past Medical History:  Diagnosis Date  . Anxiety    pt. reported having panic attacks last year  . Depression   . GERD (gastroesophageal reflux disease)   . History of bleeding ulcers 1986   hospitalized  . PTSD (post-traumatic stress disorder)     PAST SURGICAL HISTORY: Past Surgical History:  Procedure Laterality Date  . STOMACH SURGERY  1986   due to ulcers, pt unsure of procedure  FAMILY HISTORY: Family History  Problem Relation Age of Onset  . COPD Mother   . Heart attack Father   . Arthritis Brother     SOCIAL HISTORY:  Social History   Social History  . Marital status: Divorced    Spouse name: N/A  . Number of children: N/A  . Years of education: N/A   Occupational History  . Not on file.   Social History Main Topics  . Smoking status: Current Every Day Smoker    Packs/day: 1.00     Years: 53.00    Types: Cigarettes  . Smokeless tobacco: Current User    Types: Chew  . Alcohol use No  . Drug use: Yes     Comment: marijuana   . Sexual activity: Not on file   Other Topics Concern  . Not on file   Social History Narrative   ** Merged History Encounter **       In Divorce 3 sons- youngest son in [place], other sons live locally 1 daughter- lives locally Brother and sisters live locally (1 of 7 children) Retired Cabin crew  Completed HS and technically schools Enjoys golf/outside activities      PHYSICAL EXAM  GENERAL EXAM/CONSTITUTIONAL: Vitals:  Vitals:   03/03/17 1253  BP: (!) 153/70  Pulse: 74  Weight: 108 lb 3.2 oz (49.1 kg)  Height: 5\' 8"  (1.727 m)     Body mass index is 16.45 kg/m.  No exam data present  Patient is in no distress; well developed, nourished and groomed; neck is supple  CARDIOVASCULAR:  Examination of carotid arteries is normal; no carotid bruits  Regular rate and rhythm, no murmurs  Examination of peripheral vascular system by observation and palpation is normal  EYES:  Ophthalmoscopic exam of optic discs and posterior segments is normal; no papilledema or hemorrhages  MUSCULOSKELETAL:  Gait, strength, tone, movements noted in Neurologic exam below  NEUROLOGIC: MENTAL STATUS:  No flowsheet data found.  awake, alert, oriented to person, place and time  recent and remote memory intact  normal attention and concentration  language fluent, comprehension intact, naming intact,   fund of knowledge appropriate  CRANIAL NERVE:   2nd - no papilledema on fundoscopic exam  2nd, 3rd, 4th, 6th - pupils equal and reactive to light, visual fields full to confrontation, extraocular muscles intact, no nystagmus  5th - facial sensation symmetric  7th - facial strength symmetric  8th - hearing intact  9th - palate elevates symmetrically, uvula midline  11th - shoulder shrug symmetric  12th - tongue  protrusion midline  HOARSE VOICE  MOTOR:   normal bulk and tone, full strength in the BUE, BLE  SENSORY:   normal and symmetric to light touch, temperature, vibration  COORDINATION:   finger-nose-finger, fine finger movements normal  REFLEXES:   deep tendon reflexes present and symmetric  BRISK AT KNEES WITH CROSSED ADDUCTORS  TRACE AT ANKLES  GAIT/STATION:   narrow based gait; able to walk on toes, heels; DIFF WITH TANDEM; romberg is negative    DIAGNOSTIC DATA (LABS, IMAGING, TESTING) - I reviewed patient records, labs, notes, testing and imaging myself where available.  Lab Results  Component Value Date   WBC 8.7 02/10/2017   HGB 11.1 (L) 02/10/2017   HCT 33.5 (L) 02/10/2017   MCV 87.9 02/10/2017   PLT 336 02/10/2017      Component Value Date/Time   NA 133 (L) 02/11/2017 0247   K 3.7 02/11/2017 0247   CL 98 (L)  02/11/2017 0247   CO2 27 02/11/2017 0247   GLUCOSE 96 02/11/2017 0247   BUN 8 02/11/2017 0247   CREATININE 0.80 02/11/2017 0247   CREATININE 0.85 10/28/2014 1509   CALCIUM 9.0 02/11/2017 0247   PROT 5.5 (L) 02/10/2017 0251   ALBUMIN 2.7 (L) 02/10/2017 0251   AST 17 02/10/2017 0251   ALT 14 (L) 02/10/2017 0251   ALKPHOS 68 02/10/2017 0251   BILITOT 0.6 02/10/2017 0251   GFRNONAA >60 02/11/2017 0247   GFRAA >60 02/11/2017 0247   Lab Results  Component Value Date   CHOL 239 (H) 02/07/2017   HDL 61 02/07/2017   LDLCALC 150 (H) 02/07/2017   TRIG 142 02/07/2017   CHOLHDL 3.9 02/07/2017   Lab Results  Component Value Date   HGBA1C 5.8 (H) 02/07/2017   Lab Results  Component Value Date   VITAMINB12 581 02/07/2017   Lab Results  Component Value Date   TSH 0.222 (L) 02/06/2017    02/06/17 CT head [I reviewed images myself and agree with interpretation. -VRP]  - No acute intracranial abnormality or acute traumatic injury identified. - Normal for age noncontrast CT appearance of the brain.  02/08/17 MRI thoracic spine [I reviewed  images myself and agree with interpretation. -VRP]  1. Normal alignment and no acute bony findings or worrisome bone lesions. 2. Normal MR appearance of the thoracic spinal cord. No cord lesions or abnormal cord enhancement. 3. Small disc protrusions and osteophytic spurring at T4-5, T8-9 and T10-11 but no neural compression. 4. Moderate wall thickening of the distal esophagus and enhancement. This could be esophagitis but could not exclude infiltrating tumor. Radiation changes are also possible. Recommend correlation with endoscopy or barium esophagram.  02/21/17 DG esophagus 1. Smooth narrowing of the distal esophagus suggesting a reflux stricture or scar tissue. The 13 mm barium pill would not pass through this area. 2. Mild distal esophageal mucosal fold thickening could be due to reflux esophagitis. 3. No definite hiatal hernia or demonstrable GE reflux. 4. Nonspecific esophageal dysmotility.  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 Initially seizures arise from the left posterior quadrant, then as the study progresses, epileptiform discharges are seen in a generalized fashion. He is seen to have several episodes of forced head turn to the right and was confused throughout the study. Findings were discussed with Dr. Cherylynn Ridges on 6/29 at 9:45 AM.  02/08/17 overnight EEG with video - This EEG opens with an unusual pattern of diffuse polyspikes alternating with diffuse generalized rhythmic delta activity that may be indicative of diffuse cortical irritability and lies on an ictal-interictal continuum. There is no obvious focality. This alternated with normal awake EEG and then disappeared after the first hour.   - Subsequently, the EEG is indicative of a mild diffuse encephalopathy, non-specific as to etiology. No further interictal discharges or seizures are seen.       ASSESSMENT  AND PLAN  71 y.o. year old male here with history of alcohol abuse, in remission since 2011, on chronic Xanax and Ambien since 2013, with possible Xanax withdrawal seizure in June 2018. Patient and family reported that he had stopped his Xanax prior to seizure in June 2018, but patient tells me he was taking low-dose Xanax illicitly leading up to the seizure. Therefore possibility of alternate causes seizures should be considered. I will check MRI of the brain to further evaluate.   Dx:  1.  Alcohol abuse, in remission   2. Benzodiazepine withdrawal with delirium (HCC)   3. Withdrawal seizures, with delirium (HCC)      PLAN:  SEIZURE DISORDER (? Xanax withdrawal vs other secondary cause; last seizure February 09, 2017) - check MRI brain  - continue levetiracetam 500mg  twice a day - According to [place] law, you can not drive unless you are seizure / syncope free for at least 6 months and under physician's care.  - Please maintain precautions. Do not participate in activities where a loss of awareness could harm you or someone else. No swimming alone, no tub bathing, no hot tubs, no driving, no operating motorized vehicles (cars, ATVs, motocycles, etc), lawnmowers, power tools or firearms. No standing at heights, such as rooftops, ladders or stairs. Avoid hot objects such as stoves, heaters, open fires. Wear a helmet when riding a bicycle, scooter, skateboard, etc. and avoid areas of traffic. Set your water heater to 120 degrees or less.  MOOD DISTURBANCE / DEPRESSION / ANXIETY - continue lexapro - recommend to establish with psychiatry / psychology  Orders Placed This Encounter  Procedures  . MR BRAIN W WO CONTRAST   Meds ordered this encounter  Medications  . levETIRAcetam (KEPPRA) 500 MG tablet    Sig: Take 1 tablet (500 mg total) by mouth 2 (two) times daily.    Dispense:  180 tablet    Refill:  4   Return in about 6 months (around 09/03/2017).  I reviewed images, labs, notes, records  myself. I summarized findings and reviewed with patient, for this high risk condition (seizures) requiring high complexity decision making.     Suanne MarkerVIKRAM R. Sho Salguero, MD 03/03/2017, 1:24 PM Certified in Neurology, Neurophysiology and Neuroimaging  [facility] [address], [address] [place], [place] [address] [phone]

## 2017-03-05 ENCOUNTER — Telehealth: Payer: Self-pay | Admitting: Diagnostic Neuroimaging

## 2017-03-05 ENCOUNTER — Telehealth: Payer: Self-pay | Admitting: Family

## 2017-03-12 NOTE — Telephone Encounter (Signed)
Noted. Very to sorry to hear this. -VRP
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
## 2017-03-12 NOTE — Telephone Encounter (Signed)
Left message requesting  call back.
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
## 2017-03-12 NOTE — Telephone Encounter (Signed)
Called in to speak with PCP about signing off on a Death Certificate    He is requesting a call back at:    [facility] - [phone]
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
## 2017-03-12 NOTE — Telephone Encounter (Signed)
I called daughter, Fran LowesHallie and she stated that her aunt had called her and told her that the patient had passed away at 1500 in a bed at her aunts house. (this was all she relayed to me).   I relayed that was so sorry to hear this.  I would let Dr. Marjory LiesPenumalli know.

## 2017-03-12 NOTE — Telephone Encounter (Signed)
Could reduce levetiracetam to 250mg  twice a day.

## 2017-03-12 NOTE — Telephone Encounter (Signed)
[facility], daughter of pt to return call.

## 2017-03-12 DEATH — deceased

## 2017-03-17 ENCOUNTER — Ambulatory Visit: Payer: Self-pay | Admitting: Family

## 2017-09-03 ENCOUNTER — Ambulatory Visit: Payer: Self-pay | Admitting: Diagnostic Neuroimaging

## 2018-02-05 IMAGING — RF DG ESOPHAGUS
7 series · 14 of 24 positions shown · non-contrast
Comparison: MRI thoracic spine from 02/08/2017 showing mild distal
esophageal wall thickening.

CLINICAL DATA: Dysphasia, heartburn.

EXAM:
ESOPHOGRAM / BARIUM SWALLOW / BARIUM TABLET STUDY
TECHNIQUE: Combined double contrast and single contrast examination performed
using effervescent crystals, thick barium liquid, and thin barium
liquid. The patient was observed with fluoroscopy swallowing a 13 mm
barium sulphate tablet.
FLUOROSCOPY TIME:  Fluoroscopy Time:  3 minutes and 0 seconds
Radiation Exposure Index (if provided by the fluoroscopic device):
71 mGy
Number of Acquired Spot Images: 0

[Series 1: sequence · 2 of 14 frames shown (1 of 6)]
[frame 3/14]
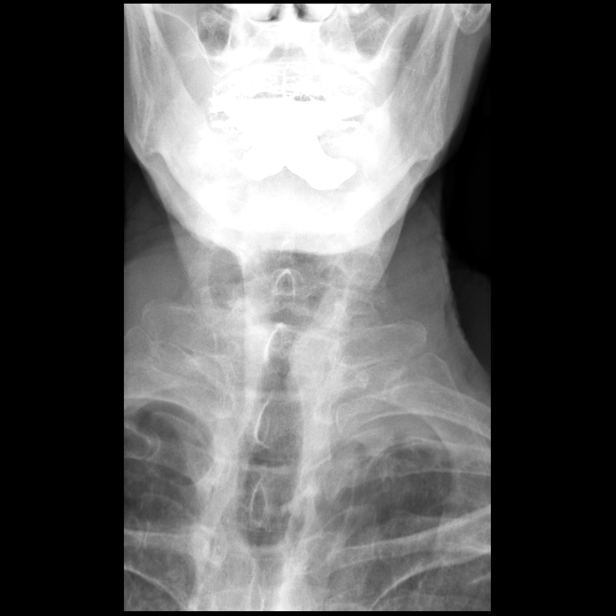
[frame 12/14]
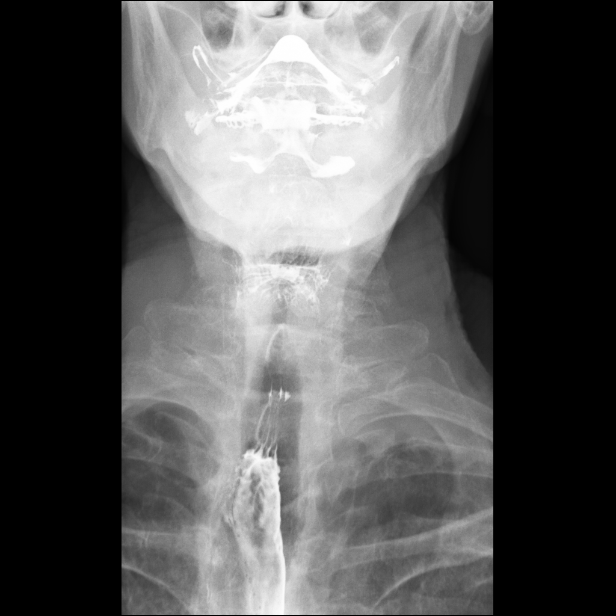

[Series 2: sequence · 1 of 12 frames shown (2 of 6)]
[frame 10/12]
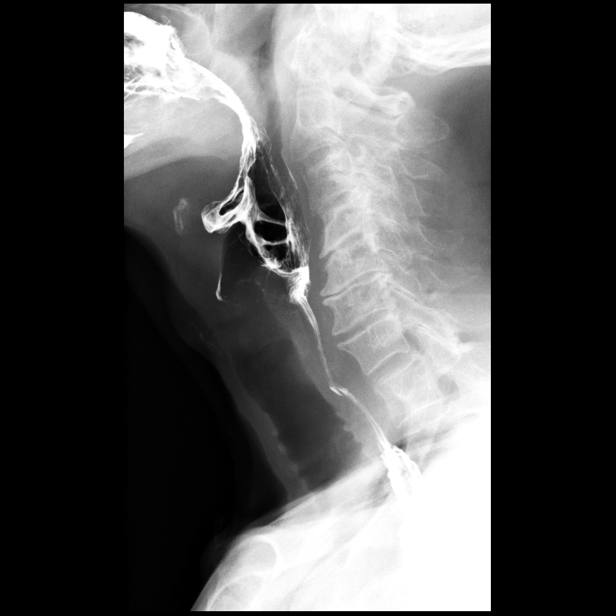

[Series 3: sequence · 2 of 11 frames shown (3 of 6)]
[frame 2/11]
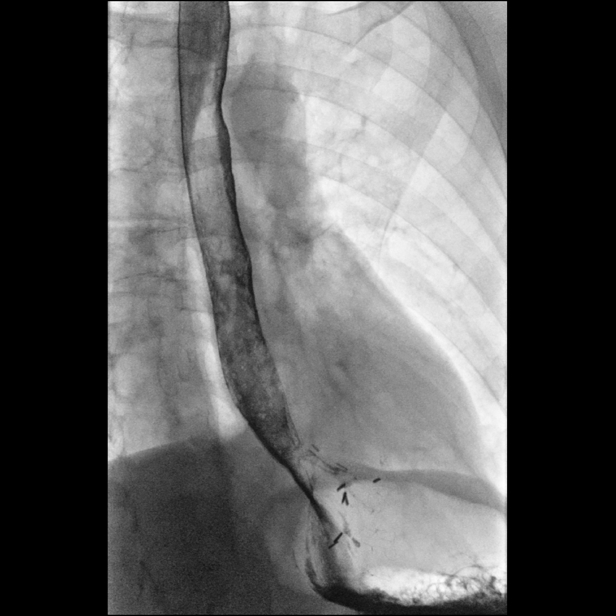
[frame 6/11]
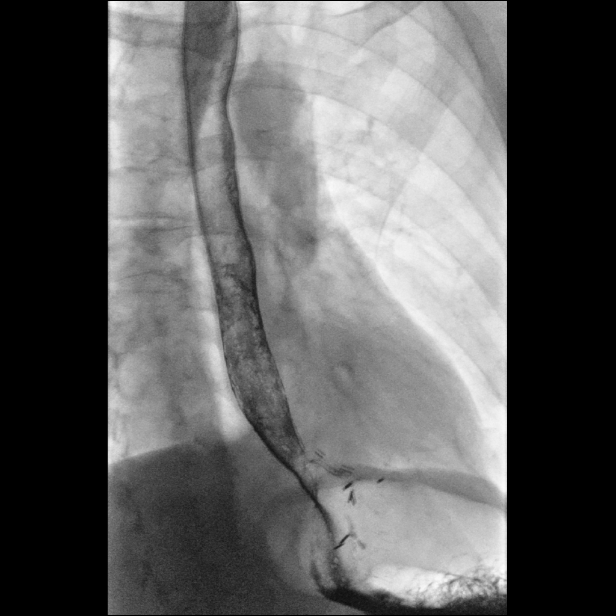

[Series 4: sequence · 2 of 10 frames shown (4 of 6)]
[frame 2/10]
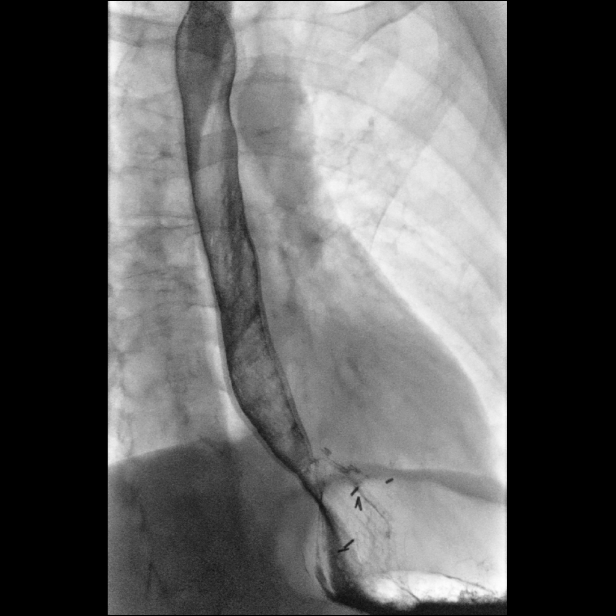
[frame 9/10]
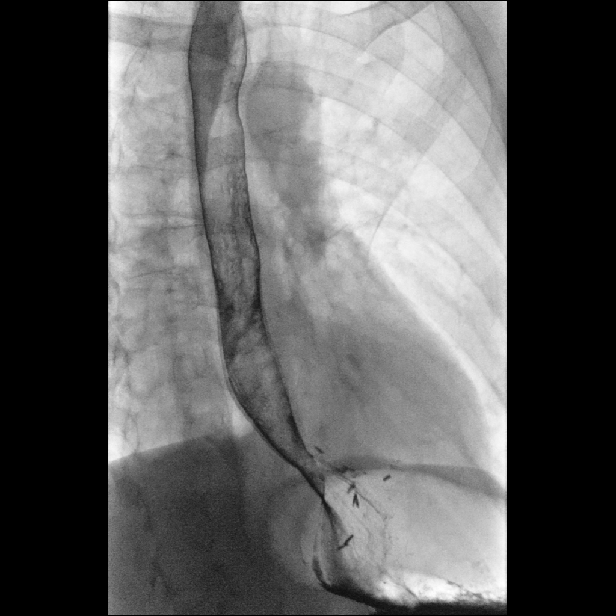

[Series 6: sequence · 2 of 15 frames shown (5 of 6)]
[frame 3/15]
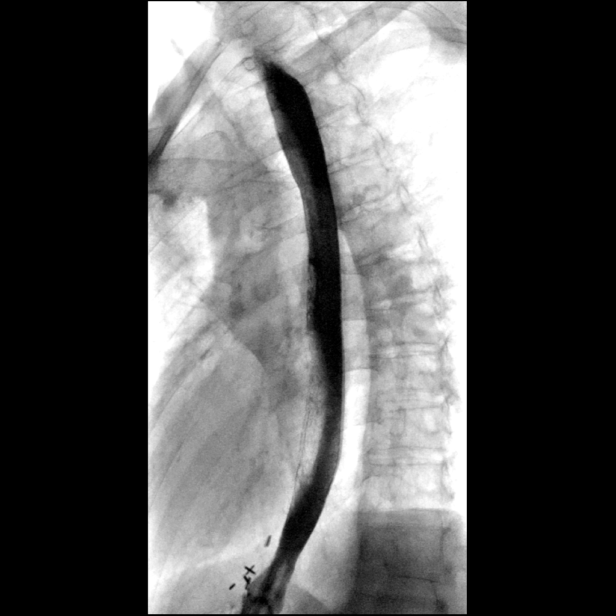
[frame 13/15]
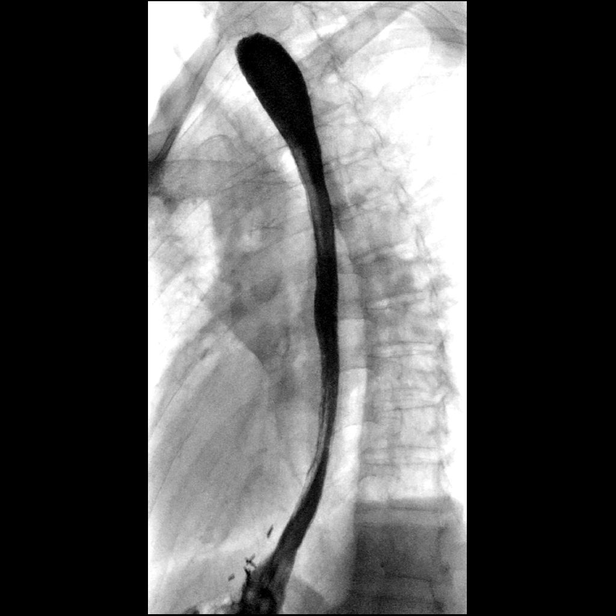

[Series 7: sequence · 2 of 20 frames shown (6 of 6)]
[frame 4/20]
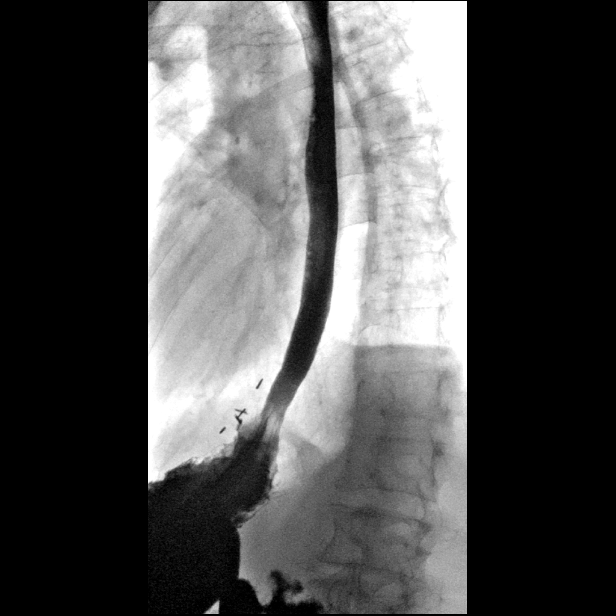
[frame 18/20]
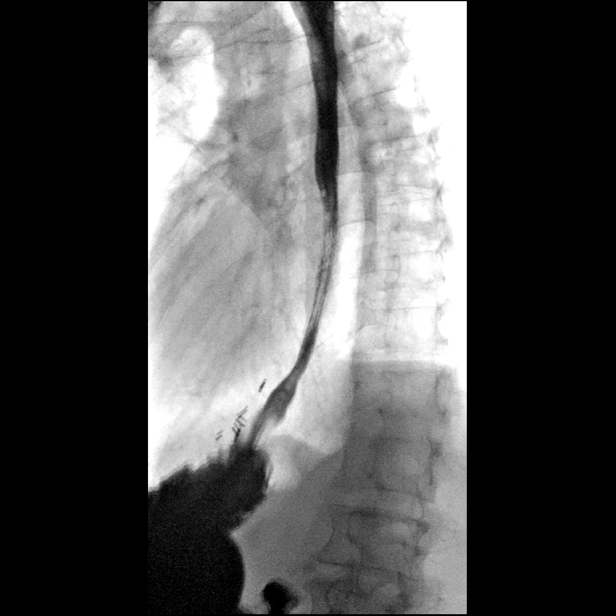

[Series 8: one shot · 3 of 6 slices shown]
[im 1/6]
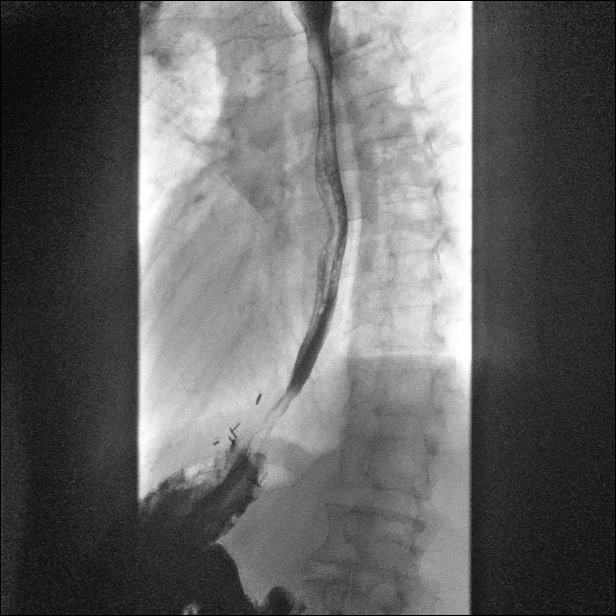
[im 4/6]
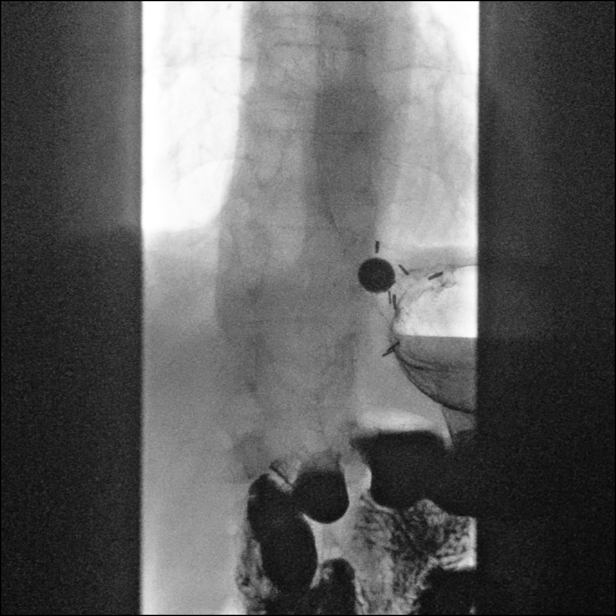
[im 6/6]
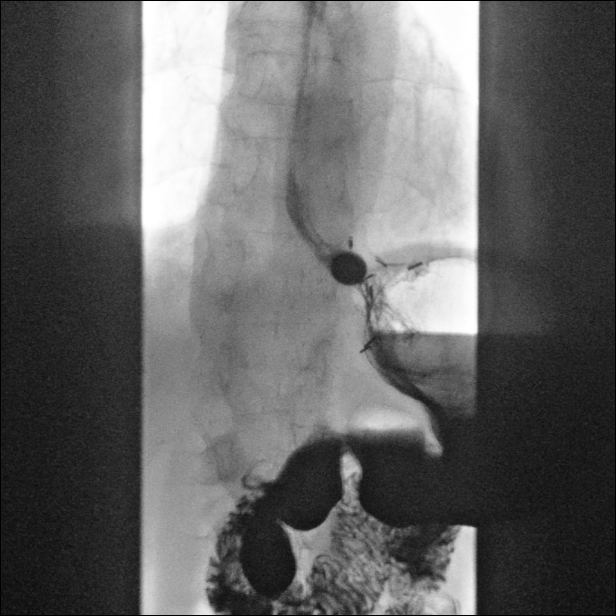

[14 of 24 positions shown; findings below may reference images not displayed]

FINDINGS: Initial barium swallows demonstrate mild laryngeal penetration but
no aspiration. Advanced degenerative cervical spondylosis with
anterior spurring and mild mass effect on the posterior esophagus.
No webs, strictures or diverticuli.

Nonspecific esophageal dysmotility with occasional disruption of the
primary peristaltic wave and occasional tertiary contractions.
Surgical changes are noted down near the GE junction with surgical
clips. There is a persistent areas smooth narrowing of the distal
esophagus suggesting a reflux stricture or scar tissue. No masses
identified. The mucosal folds are somewhat thickened suggesting
esophagitis.

No hiatal hernia or demonstrable GE reflux. The 13 mm barium pill
would not pass into the stomach.
IMPRESSION: 1. Smooth narrowing of the distal esophagus suggesting a reflux
stricture or scar tissue. The 13 mm barium pill would not pass
through this area.
2. Mild distal esophageal mucosal fold thickening could be due to
reflux esophagitis.
3. No definite hiatal hernia or demonstrable GE reflux.
4. Nonspecific esophageal dysmotility.
